# Patient Record
Sex: Female | Born: 1970 | Hispanic: Yes | Marital: Single | Smoking: Never smoker
Health system: Southern US, Community
[De-identification: ages and names within clinical notes are randomized; demographics above are authoritative.]

## PROBLEM LIST (undated history)

## (undated) DIAGNOSIS — C55 Malignant neoplasm of uterus, part unspecified: Secondary | ICD-10-CM

## (undated) DIAGNOSIS — R7303 Prediabetes: Secondary | ICD-10-CM

## (undated) HISTORY — DX: Prediabetes: R73.03

## (undated) HISTORY — DX: Malignant neoplasm of uterus, part unspecified: C55

## (undated) HISTORY — PX: ABDOMINAL HYSTERECTOMY: SHX81

## (undated) HISTORY — PX: VAGINAL HYSTERECTOMY: SUR661

---

## 2016-12-25 ENCOUNTER — Encounter (INDEPENDENT_AMBULATORY_CARE_PROVIDER_SITE_OTHER): Payer: Self-pay | Admitting: Physician Assistant

## 2016-12-25 ENCOUNTER — Ambulatory Visit (INDEPENDENT_AMBULATORY_CARE_PROVIDER_SITE_OTHER): Payer: Self-pay | Admitting: Physician Assistant

## 2016-12-25 VITALS — BP 120/84 | HR 69 | Temp 97.7°F | Ht <= 58 in | Wt 187.8 lb

## 2016-12-25 DIAGNOSIS — S39012A Strain of muscle, fascia and tendon of lower back, initial encounter: Secondary | ICD-10-CM

## 2016-12-25 DIAGNOSIS — E669 Obesity, unspecified: Secondary | ICD-10-CM

## 2016-12-25 DIAGNOSIS — M545 Low back pain, unspecified: Secondary | ICD-10-CM

## 2016-12-25 DIAGNOSIS — G8929 Other chronic pain: Secondary | ICD-10-CM

## 2016-12-25 DIAGNOSIS — M549 Dorsalgia, unspecified: Secondary | ICD-10-CM

## 2016-12-25 LAB — POCT URINALYSIS DIPSTICK
BILIRUBIN UA: NEGATIVE
Blood, UA: NEGATIVE
GLUCOSE UA: NEGATIVE
KETONES UA: NEGATIVE
LEUKOCYTES UA: NEGATIVE
NITRITE UA: NEGATIVE
Protein, UA: NEGATIVE
Spec Grav, UA: 1.02 (ref 1.030–1.035)
Urobilinogen, UA: 0.2 (ref ?–2.0)
pH, UA: 7 (ref 5.0–8.0)

## 2016-12-25 MED ORDER — CYCLOBENZAPRINE HCL 5 MG PO TABS: 5.0000 mg | ORAL_TABLET | Freq: Every day | ORAL | 0 refills | Status: DC

## 2016-12-25 MED ORDER — IBUPROFEN 600 MG PO TABS: 600.0000 mg | ORAL_TABLET | Freq: Three times a day (TID) | ORAL | 0 refills | Status: DC | PRN

## 2016-12-25 MED FILL — IBUPROFEN 600 MG TABLET: 600 | 10 days supply | Qty: 30 | Fill #0

## 2016-12-25 MED FILL — CYCLOBENZAPRINE 5 MG TABLET: 5 | 10 days supply | Qty: 10 | Fill #0

## 2016-12-25 NOTE — Patient Instructions (Signed)
Distensin muscular. (Muscle Strain) Una distensin muscular (estiramiento muscular) ocurre cuando un msculo se estira ms all de la longitud normal. Ocurre cuando una fuerza violenta bruscamente estira demasiado el msculo. Generalmente se desgarran algunas de las fibras del msculo. La distensin muscular es comn en los atletas. La recuperacin normalmente tarda de 1 a 2semanas. La curacin completa tarda de 5 a 6semanas. CUIDADOS EN EL HOGAR  Siga el mtodo PRICE (por sus siglas en ingls) de tratamiento para que la lesin mejore. Hgalo durante los 2 a 3 primeros das despus de la lesin: ? Proteccin. Proteja el msculo para evitar que se vuelva a lesionar. ? Reposo. Limite la actividad y descanse la parte del cuerpo lesionada. ? Hielo. Ponga el hielo en una bolsa plstica. Coloque una toalla entre la piel y la bolsa de hielo. Luego aplique el hielo y djelo actuar de 15 a 20minutos por hora. Despus del tercer da, cambie a compresas de calor hmedo. ? Compresin. Use una frula o venda elstica en la zona lesionada para brindar alivio. No la ajuste demasiado. ? Elevacin. Eleve la zona lesionada por encima del nivel del corazn.  Solo tome los medicamentos que le haya indicado su mdico.  Realice un calentamiento antes de hacer ejercicio para prevenir distensiones musculares futuras.  SOLICITE AYUDA SI:  Siente ms dolor o inflamacin (hinchazn) en la zona lesionada.  Siente adormecimiento, hormigueo o nota una prdida de fuerza en la zona lesionada.  ASEGRESE DE QUE:  Comprende estas instrucciones.  Controlar su afeccin.  Recibir ayuda de inmediato si no mejora o si empeora.  Esta informacin no tiene como fin reemplazar el consejo del mdico. Asegrese de hacerle al mdico cualquier pregunta que tenga. Document Released: 12/25/2008 Document Revised: 07/19/2013 Document Reviewed: 04/27/2013 Elsevier Interactive Patient Education  2017 Elsevier Inc.  

## 2016-12-25 NOTE — Progress Notes (Signed)
Subjective:  Patient ID: Nina Hawkins, female    DOB: [birth year]  Age: 46 y.o. MRN: [id number]  CC: back pain, shoulder pain  HPI Nina Hawkins is a RHD 46 y.o. female with no significant medical history presents with a multiple month history on left sided LBP and left sided upper back pain. Attributed to picking up a heavy object in a twisting motion months ago. Pain waxes and wanes. Better with rest. Has taken Tylenol with some relief. Denies chest pain, SOB, associated HA, abdominal pain, f/c/n/v, rash, or GI/GU sxs.   ROS Review of Systems  Constitutional: Negative for chills, fever and malaise/fatigue.  Eyes: Negative for blurred vision.  Respiratory: Negative for shortness of breath.   Cardiovascular: Negative for chest pain and palpitations.  Gastrointestinal: Negative for abdominal pain and nausea.  Genitourinary: Negative for dysuria and hematuria.  Musculoskeletal: Positive for back pain, joint pain and myalgias (left side of back). Negative for falls and neck pain.  Skin: Negative for rash.  Neurological: Negative for tingling and headaches.  Psychiatric/Behavioral: Negative for depression. The patient is not nervous/anxious.     Objective:  BP 120/84 (BP Location: Left Arm, Patient Position: Sitting, Cuff Size: Large)   Pulse 69   Temp 97.7 F (36.5 C) (Oral)   Ht 4\' 10"  (1.473 m)   Wt 187 lb 12.8 oz (85.2 kg)   LMP  (Exact Date)   SpO2 100%   BMI 39.25 kg/m   BP/Weight 12/25/2016  Systolic BP 120  Diastolic BP 84  Wt. (Lbs) 187.8  BMI 39.25     Physical Exam  Constitutional: She is oriented to person, place, and time.  Obese, NAD, polite  HENT:  Head: Normocephalic and atraumatic.  Eyes: No scleral icterus.  Neck: Normal range of motion. Neck supple. No thyromegaly present.  Cardiovascular: Normal rate, regular rhythm and normal heart sounds.   Pulmonary/Chest: Effort normal and breath sounds normal.  Abdominal: Soft. Bowel sounds are normal. There is  no tenderness.  Musculoskeletal: She exhibits no edema.  Back with full aROM. All provacative testing for the left shoulder positive but inconsistent with pain level that patient portrayed.   Neurological: She is alert and oriented to person, place, and time.  Skin: Skin is warm and dry. No rash noted. No erythema. No pallor.  Psychiatric: She has a normal mood and affect. Her behavior is normal. Thought content normal.  Vitals reviewed.    Assessment & Plan:   1. Back strain, initial encounter - cyclobenzaprine (FLEXERIL) 5 MG tablet; Take 1 tablet (5 mg total) by mouth at bedtime.  Dispense: 10 tablet; Refill: 0 - ibuprofen (ADVIL,MOTRIN) 600 MG tablet; Take 1 tablet (600 mg total) by mouth every 8 (eight) hours as needed.  Dispense: 30 tablet; Refill: 0  2. Chronic left-sided low back pain without sciatica - Urinalysis Dipstick  3. Upper back pain - Likely muscle strain.  4. Obesity, unspecified classification, unspecified obesity type, unspecified whether serious comorbidity present - CBC with Differential/Platelet - Comprehensive metabolic panel - TSH   Meds ordered this encounter  Medications  . cyclobenzaprine (FLEXERIL) 5 MG tablet    Sig: Take 1 tablet (5 mg total) by mouth at bedtime.    Dispense:  10 tablet    Refill:  0    Order Specific Question:   Supervising Provider    Answer:   Quentin AngstJEGEDE, OLUGBEMIGA E [id number]]  . ibuprofen (ADVIL,MOTRIN) 600 MG tablet    Sig: Take 1 tablet (600 mg total) by  mouth every 8 (eight) hours as needed.    Dispense:  30 tablet    Refill:  0    Order Specific Question:   Supervising Provider    Answer:   Quentin Angst [[id number]]    Follow-up: Return in about 4 weeks (around 01/22/2017) for physical exam.   Loletta Specter PA

## 2016-12-26 LAB — CBC WITH DIFFERENTIAL/PLATELET
BASOS: 0 %
Basophils Absolute: 0 10*3/uL (ref 0.0–0.2)
EOS (ABSOLUTE): 0.3 10*3/uL (ref 0.0–0.4)
EOS: 4 %
HEMATOCRIT: 42.1 % (ref 34.0–46.6)
Hemoglobin: 13.8 g/dL (ref 11.1–15.9)
IMMATURE GRANS (ABS): 0 10*3/uL (ref 0.0–0.1)
IMMATURE GRANULOCYTES: 0 %
LYMPHS: 31 %
Lymphocytes Absolute: 2.3 10*3/uL (ref 0.7–3.1)
MCH: 27.7 pg (ref 26.6–33.0)
MCHC: 32.8 g/dL (ref 31.5–35.7)
MCV: 85 fL (ref 79–97)
MONOCYTES: 7 %
Monocytes Absolute: 0.6 10*3/uL (ref 0.1–0.9)
NEUTROS PCT: 58 %
Neutrophils Absolute: 4.3 10*3/uL (ref 1.4–7.0)
PLATELETS: 269 10*3/uL (ref 150–379)
RBC: 4.98 x10E6/uL (ref 3.77–5.28)
RDW: 13.9 % (ref 12.3–15.4)
WBC: 7.5 10*3/uL (ref 3.4–10.8)

## 2016-12-26 LAB — COMPREHENSIVE METABOLIC PANEL
ALK PHOS: 61 IU/L (ref 39–117)
ALT: 15 IU/L (ref 0–32)
AST: 14 IU/L (ref 0–40)
Albumin/Globulin Ratio: 1.5 (ref 1.2–2.2)
Albumin: 4 g/dL (ref 3.5–5.5)
BUN/Creatinine Ratio: 23 (ref 9–23)
BUN: 15 mg/dL (ref 6–24)
Bilirubin Total: 0.2 mg/dL (ref 0.0–1.2)
CALCIUM: 9.3 mg/dL (ref 8.7–10.2)
CO2: 23 mmol/L (ref 18–29)
CREATININE: 0.64 mg/dL (ref 0.57–1.00)
Chloride: 99 mmol/L (ref 96–106)
GFR calc Af Amer: 125 mL/min/{1.73_m2} (ref >59)
GFR calc non Af Amer: 108 mL/min/{1.73_m2} (ref >59)
GLOBULIN, TOTAL: 2.6 g/dL (ref 1.5–4.5)
Glucose: 96 mg/dL (ref 65–99)
Potassium: 4.2 mmol/L (ref 3.5–5.2)
Sodium: 140 mmol/L (ref 134–144)
TOTAL PROTEIN: 6.6 g/dL (ref 6.0–8.5)

## 2016-12-26 LAB — TSH: TSH: 4 u[IU]/mL (ref 0.450–4.500)

## 2017-01-12 ENCOUNTER — Telehealth (INDEPENDENT_AMBULATORY_CARE_PROVIDER_SITE_OTHER): Payer: Self-pay | Admitting: Physician Assistant

## 2017-01-12 NOTE — Telephone Encounter (Signed)
Patient called requesting lab results  Please follow up with patient Ph: [phone]

## 2017-01-12 NOTE — Telephone Encounter (Signed)
Routed to PCP. Nina Hawkins, CMA  

## 2017-01-13 ENCOUNTER — Other Ambulatory Visit (INDEPENDENT_AMBULATORY_CARE_PROVIDER_SITE_OTHER): Payer: Self-pay | Admitting: Physician Assistant

## 2017-01-13 NOTE — Telephone Encounter (Signed)
I called both listed numbers and left message to call back to discuss laboratory results.

## 2017-02-01 ENCOUNTER — Ambulatory Visit (INDEPENDENT_AMBULATORY_CARE_PROVIDER_SITE_OTHER): Payer: Self-pay | Admitting: Physician Assistant

## 2017-02-02 ENCOUNTER — Ambulatory Visit (INDEPENDENT_AMBULATORY_CARE_PROVIDER_SITE_OTHER): Payer: Self-pay | Admitting: Physician Assistant

## 2017-02-02 ENCOUNTER — Encounter (INDEPENDENT_AMBULATORY_CARE_PROVIDER_SITE_OTHER): Payer: Self-pay | Admitting: Physician Assistant

## 2017-02-02 VITALS — BP 122/83 | HR 71 | Temp 97.8°F | Ht <= 58 in | Wt 181.2 lb

## 2017-02-02 DIAGNOSIS — Z114 Encounter for screening for human immunodeficiency virus [HIV]: Secondary | ICD-10-CM

## 2017-02-02 DIAGNOSIS — M542 Cervicalgia: Secondary | ICD-10-CM

## 2017-02-02 DIAGNOSIS — G8929 Other chronic pain: Secondary | ICD-10-CM

## 2017-02-02 DIAGNOSIS — M545 Low back pain, unspecified: Secondary | ICD-10-CM

## 2017-02-02 DIAGNOSIS — Z23 Encounter for immunization: Secondary | ICD-10-CM

## 2017-02-02 DIAGNOSIS — R1013 Epigastric pain: Secondary | ICD-10-CM

## 2017-02-02 DIAGNOSIS — Z1231 Encounter for screening mammogram for malignant neoplasm of breast: Secondary | ICD-10-CM

## 2017-02-02 MED ORDER — ACETAMINOPHEN 500 MG PO TABS: 1000.0000 mg | ORAL_TABLET | Freq: Three times a day (TID) | ORAL | 0 refills | Status: AC | PRN

## 2017-02-02 MED ORDER — OMEPRAZOLE 40 MG PO CPDR: 40.0000 mg | DELAYED_RELEASE_CAPSULE | Freq: Every day | ORAL | 3 refills | Status: DC

## 2017-02-02 MED ORDER — CYCLOBENZAPRINE HCL 10 MG PO TABS: 10.0000 mg | ORAL_TABLET | Freq: Every day | ORAL | 0 refills | Status: DC

## 2017-02-02 MED FILL — CYCLOBENZAPRINE 10 MG TAB: 10 | 7 days supply | Qty: 14 | Fill #0

## 2017-02-02 MED FILL — OMEPRAZOLE DR 40 MG CAPSULE: 40 | 30 days supply | Qty: 30 | Fill #0

## 2017-02-02 NOTE — Progress Notes (Signed)
Subjective:  Patient ID: Nina Hawkins, female    DOB: April 05, 1971  Age: 46 y.o. MRN: [id number]  CC: abdominal pain, neck pain, back pain  HPI Nina Hawkins is a 46 y.o. female with a PMH of back pain and neck pain. Presents today with complaint of epigastric discomfort for approximately 6 months. Fatty foods aggravate pain. Associated with constipation and bloating. No nausea, vomiting, hematochezia. Also has persistent cervicalgia and back pain since last visit. Says NSAID and Flexeril helped with the pain but her job is physically demanding, up to 14 hours per day, which has contributed to her continued pains. Does not endorse any other symptoms.   Outpatient Medications Prior to Visit  Medication Sig Dispense Refill  . cyclobenzaprine (FLEXERIL) 5 MG tablet Take 1 tablet (5 mg total) by mouth at bedtime. 10 tablet 0  . ibuprofen (ADVIL,MOTRIN) 600 MG tablet Take 1 tablet (600 mg total) by mouth every 8 (eight) hours as needed. 30 tablet 0   No facility-administered medications prior to visit.       Review of Systems  Constitutional: Negative for chills, fever and malaise/fatigue.  Eyes: Negative for blurred vision.  Respiratory: Negative for shortness of breath.   Cardiovascular: Negative for chest pain and palpitations.  Gastrointestinal: Positive for abdominal pain and constipation. Negative for blood in stool, melena, nausea and vomiting.  Genitourinary: Negative for dysuria and hematuria.  Musculoskeletal: Positive for back pain and neck pain. Negative for joint pain and myalgias.  Skin: Negative for rash.  Neurological: Negative for tingling and headaches.  Psychiatric/Behavioral: Negative for depression. The patient is not nervous/anxious.     Objective:  BP 122/83 (BP Location: Left Arm, Patient Position: Sitting, Cuff Size: Large)   Pulse 71   Temp 97.8 F (36.6 C) (Oral)   Ht  (1.473 m)   Wt 181 lb 3.2 oz (82.2 kg)   SpO2 97%   BMI 37.87 kg/m   BP/Weight  02/02/2017 12/25/2016  Systolic BP 122 120  Diastolic BP 83 84  Wt. (Lbs) 181.2 187.8  BMI 37.87 39.25      Physical Exam  Constitutional: She is oriented to person, place, and time.  Well developed, obese, NAD, polite  HENT:  Head: Normocephalic and atraumatic.  Eyes: No scleral icterus.  Neck: Normal range of motion. Neck supple. No thyromegaly present.  Cardiovascular: Normal rate, regular rhythm and normal heart sounds.   Pulmonary/Chest: Effort normal and breath sounds normal.  Abdominal: Soft. Bowel sounds are normal. She exhibits no distension and no mass. There is tenderness (mild generalized TTP in all quadrants). There is no rebound and no guarding.  Musculoskeletal: She exhibits no edema or deformity.  Neck and back with full aROM and mild TTP along the left paraspinals of C-T-L spine.  Neurological: She is alert and oriented to person, place, and time. No cranial nerve deficit. Coordination normal.  Skin: Skin is warm and dry. No rash noted. No erythema. No pallor.  Psychiatric: She has a normal mood and affect. Her behavior is normal. Thought content normal.  Vitals reviewed.    Assessment & Plan:   1. Screening mammogram, encounter for - MM DIGITAL SCREENING BILATERAL; Future  2. Encounter for special screening examination for HIV - HIV antibody  3. Abdominal pain, epigastric - omeprazole (PRILOSEC) 40 MG capsule; Take 1 capsule (40 mg total) by mouth daily.  Dispense: 30 capsule; Refill: 3 - Lipase - H. pylori antibody, IgG  4. Need for Tdap vaccination - Tdap vaccine greater  than or equal to 7yo IM  5. Cervicalgia  - Increase cyclobenzaprine to 10 mg QHS - Likely attributed to occupation's requirement of prolonged standing for up to 14 hours at a time. Pt not willing to search for another occupation due to difficulty in finding work.  6. Low back pain - Begin tylenol 500 mg two tabs TID - Likely attributed to occupation's requirement of prolonged  standing for up to 14 hours at a time. Pt not willing to search for another occupation due to difficulty in finding work.  Meds ordered this encounter  Medications  . omeprazole (PRILOSEC) 40 MG capsule    Sig: Take 1 capsule (40 mg total) by mouth daily.    Dispense:  30 capsule    Refill:  3    Order Specific Question:   Supervising Provider    Answer:   Quentin Angst [id number]    Follow-up: Return in about 1 week (around 02/09/2017) for PAP smear.   Loletta Specter PA

## 2017-02-02 NOTE — Patient Instructions (Signed)
Dolor abdominal en adultos °Abdominal Pain, Adult °El dolor abdominal puede tener muchas causas. A menudo, no es grave y mejora sin tratamiento o con tratamiento en la casa. Sin embargo, a veces el dolor abdominal es intenso. El médico revisará sus antecedentes médicos y le hará un examen físico para tratar de determinar la causa del dolor abdominal. °Siga estas instrucciones en su casa: °· Tome los medicamentos de venta libre y los recetados solamente como se lo haya indicado el médico. No tome un laxante a menos que se lo haya indicado el médico. °· Beba suficiente líquido para mantener la orina clara o de color amarillo pálido. °· Controle su afección para ver si hay cambios. °· Concurra a todas las visitas de control como se lo haya indicado el médico. Esto es importante. °Comuníquese con un médico si: °· El dolor abdominal cambia o empeora. °· No tiene apetito o baja de peso sin proponérselo. °· Está estreñido o tiene diarrea durante más de 2 o 3 días. °· Tiene dolor cuando orina o defeca. °· El dolor abdominal lo despierta de noche. °· El dolor empeora con las comidas, después de comer o con determinados alimentos. °· Tiene vómitos y no puede retener nada. °· Tiene fiebre. °Solicite ayuda de inmediato si: °· El dolor no desaparece tan pronto como el médico le dijo que era esperable. °· No puede detener los vómitos. °· El dolor se siente solo en zonas del abdomen, como el lado derecho o la parte inferior izquierda del abdomen. °· Las heces son sanguinolentas o de color negro, o de aspecto alquitranado. °· Tiene dolor intenso, cólicos, o meteorismo en el abdomen. °· Tiene signos de deshidratación, por ejemplo: °? Orina oscura, muy escasa o falta de orina. °? Labios agrietados. °? Boca seca. °? Ojos hundidos. °? Somnolencia. °? Debilidad. °Esta información no tiene como fin reemplazar el consejo del médico. Asegúrese de hacerle al médico cualquier pregunta que tenga. °Document Released: 09/28/2005 Document  Revised: 09/17/2016 Document Reviewed: 03/11/2016 °Elsevier Interactive Patient Education © 2017 Elsevier Inc. ° °

## 2017-02-03 ENCOUNTER — Other Ambulatory Visit (INDEPENDENT_AMBULATORY_CARE_PROVIDER_SITE_OTHER): Payer: Self-pay | Admitting: Physician Assistant

## 2017-02-03 DIAGNOSIS — A048 Other specified bacterial intestinal infections: Secondary | ICD-10-CM

## 2017-02-03 LAB — HIV ANTIBODY (ROUTINE TESTING W REFLEX): HIV SCREEN 4TH GENERATION: NONREACTIVE

## 2017-02-03 LAB — LIPASE: Lipase: 57 U/L (ref 14–72)

## 2017-02-03 LAB — H. PYLORI ANTIBODY, IGG: H. pylori, IgG AbS: 3.74 Index Value — ABNORMAL HIGH (ref 0.00–0.79)

## 2017-02-03 MED ORDER — CLARITHROMYCIN 500 MG PO TABS: 500.0000 mg | ORAL_TABLET | Freq: Two times a day (BID) | ORAL | 0 refills | Status: AC

## 2017-02-03 MED ORDER — AMOXICILLIN 500 MG PO TABS: 1000.0000 mg | ORAL_TABLET | Freq: Two times a day (BID) | ORAL | 0 refills | Status: AC

## 2017-02-03 NOTE — Progress Notes (Signed)
H pylori IgG positive.

## 2017-02-11 ENCOUNTER — Ambulatory Visit (INDEPENDENT_AMBULATORY_CARE_PROVIDER_SITE_OTHER): Payer: Self-pay | Admitting: Physician Assistant

## 2017-03-23 ENCOUNTER — Encounter (HOSPITAL_COMMUNITY): Payer: Self-pay | Admitting: *Deleted

## 2017-03-23 ENCOUNTER — Ambulatory Visit (INDEPENDENT_AMBULATORY_CARE_PROVIDER_SITE_OTHER): Payer: Self-pay

## 2017-03-23 ENCOUNTER — Ambulatory Visit (HOSPITAL_COMMUNITY)
Admission: EM | Admit: 2017-03-23 | Discharge: 2017-03-23 | Disposition: A | Discharge status: Home / Self Care | Payer: Self-pay | Attending: Family Medicine | Admitting: Family Medicine

## 2017-03-23 DIAGNOSIS — R197 Diarrhea, unspecified: Secondary | ICD-10-CM

## 2017-03-23 DIAGNOSIS — K529 Noninfective gastroenteritis and colitis, unspecified: Secondary | ICD-10-CM

## 2017-03-23 DIAGNOSIS — R1084 Generalized abdominal pain: Secondary | ICD-10-CM

## 2017-03-23 MED ORDER — HYOSCYAMINE SULFATE SL 0.125 MG SL SUBL: 1.0000 | SUBLINGUAL_TABLET | Freq: Every morning | SUBLINGUAL | 0 refills | Status: DC

## 2017-03-23 MED FILL — HYOSCYAMINE 0.125 MG TAB SL: 0.125 | 30 days supply | Qty: 30 | Fill #0

## 2017-03-23 NOTE — ED Triage Notes (Signed)
Pt  Reports     abd   Pain          Worse   After  Eating      Denies       Any   Vomiting       some diarrhea

## 2017-03-23 NOTE — ED Provider Notes (Signed)
CSN: [id number]     Arrival date & time 03/23/17  1012 History   None    Chief Complaint  Patient presents with  . Abdominal Pain   (Consider location/radiation/quality/duration/timing/severity/associated sxs/prior Treatment) C/o abdominal pain for a month and her pain is generalized and she states it is worse after she eats. She denies any NVD.  She does not have daily BM's.   The history is provided by the patient.  Abdominal Pain  Pain location:  Generalized Pain quality: aching, bloating and fullness   Pain radiates to:  Does not radiate Pain severity:  Moderate Onset quality:  Gradual Duration:  4 weeks Timing:  Constant Progression:  Worsening Chronicity:  New Relieved by:  Nothing Worsened by:  Nothing Ineffective treatments:  None tried   History reviewed. No pertinent past medical history. Past Surgical History:  Procedure Laterality Date  . ABDOMINAL HYSTERECTOMY     History reviewed. No pertinent family history. Social History  Substance Use Topics  . Smoking status: Never Smoker  . Smokeless tobacco: Never Used  . Alcohol use Yes   OB History    No data available     Review of Systems  Constitutional: Negative.   HENT: Negative.   Eyes: Negative.   Respiratory: Negative.   Cardiovascular: Negative.   Gastrointestinal: Positive for abdominal pain.  Endocrine: Negative.   Genitourinary: Negative.   Musculoskeletal: Negative.   Allergic/Immunologic: Negative.   Neurological: Negative.   Hematological: Negative.   Psychiatric/Behavioral: Negative.     Allergies  Patient has no known allergies.  Home Medications   Prior to Admission medications   Medication Sig Start Date End Date Taking? Authorizing Provider  cyclobenzaprine (FLEXERIL) 10 MG tablet Take 1 tablet (10 mg total) by mouth at bedtime. 02/02/17   Loletta SpecterGomez, Roger David, PA-C  Hyoscyamine Sulfate SL (LEVSIN/SL) 0.125 MG SUBL Place 1 tablet under the tongue every morning. 03/23/17    Deatra Canterxford, William J, FNP  omeprazole (PRILOSEC) 40 MG capsule Take 1 capsule (40 mg total) by mouth daily. 02/02/17   Loletta SpecterGomez, Roger David, PA-C   Meds Ordered and Administered this Visit  Medications - No data to display  BP (!) 150/100 (BP Location: Right Arm)   Pulse 78   Temp 98.6 F (37 C) (Oral)   Resp 18   SpO2 100%  No data found.   Physical Exam  Constitutional: She appears well-developed and well-nourished.  HENT:  Head: Normocephalic and atraumatic.  Eyes: Conjunctivae and EOM are normal. Pupils are equal, round, and reactive to light.  Neck: Normal range of motion. Neck supple.  Cardiovascular: Normal rate, regular rhythm and normal heart sounds.   Pulmonary/Chest: Effort normal and breath sounds normal.  Abdominal: Soft. Bowel sounds are normal. There is tenderness.  TTP RUQ, LUQ, Epigastric Region, and LLQ.  No guarding or rebound tenderness.  Nursing note and vitals reviewed.   Urgent Care Course     Procedures (including critical care time)  Labs Review Labs Reviewed - No data to display  Imaging Review Dg Abd 1 View  Result Date: 03/23/2017 CLINICAL DATA:  Patient states that she's had abdominal pain since today, more pain with eating. Denies any vomiting or diarrhea. No Hx EXAM: ABDOMEN - 1 VIEW COMPARISON:  09/04/2013 FINDINGS: Normal bowel gas pattern with no evidence of obstruction. Small density projects over the left kidney. This is consistent with intrarenal stone, not evident on the prior exam. Soft tissues are otherwise unremarkable. No significant skeletal abnormality. IMPRESSION: 1. No  acute findings.  No evidence of bowel obstruction. 2. Probable small left intrarenal stone. Electronically Signed   By: Amie Portland M.D.   On: 03/23/2017 11:09     Visual Acuity Review  Right Eye Distance:   Left Eye Distance:   Bilateral Distance:    Right Eye Near:   Left Eye Near:    Bilateral Near:         MDM   1. Noninfectious gastroenteritis,  unspecified type   2. Diarrhea, unspecified type   3. Generalized abdominal pain    Levsin 0.125mg  Take one po qid prn #30  Push po fluids, rest, tylenol and motrin otc prn as directed for fever, arthralgias, and myalgias.  Follow up prn if sx's continue or persist.    Deatra Canter, FNP 03/23/17 1116

## 2017-04-06 ENCOUNTER — Ambulatory Visit: Payer: Self-pay | Attending: Internal Medicine

## 2017-04-23 ENCOUNTER — Ambulatory Visit (INDEPENDENT_AMBULATORY_CARE_PROVIDER_SITE_OTHER): Payer: Self-pay

## 2017-05-21 ENCOUNTER — Ambulatory Visit (INDEPENDENT_AMBULATORY_CARE_PROVIDER_SITE_OTHER): Payer: Self-pay | Admitting: Physician Assistant

## 2017-05-21 ENCOUNTER — Encounter (INDEPENDENT_AMBULATORY_CARE_PROVIDER_SITE_OTHER): Payer: Self-pay | Admitting: Physician Assistant

## 2017-05-21 ENCOUNTER — Other Ambulatory Visit (HOSPITAL_COMMUNITY)
Admission: RE | Admit: 2017-05-21 | Discharge: 2017-05-21 | Disposition: A | Discharge status: Home / Self Care | Payer: Self-pay | Source: Ambulatory Visit | Attending: Physician Assistant | Admitting: Physician Assistant

## 2017-05-21 VITALS — BP 132/87 | HR 67 | Temp 98.2°F | Ht <= 58 in | Wt 180.4 lb

## 2017-05-21 DIAGNOSIS — M545 Low back pain, unspecified: Secondary | ICD-10-CM

## 2017-05-21 DIAGNOSIS — Z1272 Encounter for screening for malignant neoplasm of vagina: Secondary | ICD-10-CM | POA: Insufficient documentation

## 2017-05-21 DIAGNOSIS — Z1231 Encounter for screening mammogram for malignant neoplasm of breast: Secondary | ICD-10-CM

## 2017-05-21 DIAGNOSIS — Z0000 Encounter for general adult medical examination without abnormal findings: Secondary | ICD-10-CM

## 2017-05-21 DIAGNOSIS — G8929 Other chronic pain: Secondary | ICD-10-CM

## 2017-05-21 DIAGNOSIS — M542 Cervicalgia: Secondary | ICD-10-CM

## 2017-05-21 DIAGNOSIS — Z1239 Encounter for other screening for malignant neoplasm of breast: Secondary | ICD-10-CM

## 2017-05-21 MED ORDER — NAPROXEN 500 MG PO TABS: 500.0000 mg | ORAL_TABLET | Freq: Two times a day (BID) | ORAL | 0 refills | Status: DC

## 2017-05-21 MED FILL — NAPROXEN 500 MG TABLET: 500 | 15 days supply | Qty: 30 | Fill #0

## 2017-05-21 NOTE — Progress Notes (Signed)
Subjective:  Patient ID: Nina Hawkins, female    DOB: [birth year]  Age: 46 y.o. MRN: [id number]  CC: annual exam  HPI Nina Hawkins is a 46 y.o. female with a PMH of chronic left sided LBP and left sided cervicalgia presents for an annual exam. Complains of persistent pain in the whole left side of body. Pain primarily in the left side of neck and left side of back. Initially attributed to prolonged standing at work for 14 hours per day. However, she has taken a break from work and feels the same pains. Unable to sleep on her left side due to pain. Does not endorse CP, palpitations, SOB, HA, abdominal pain, tingling, numbness, weakness, f/c/n/v, rash, or GI/GU sxs.    Outpatient Medications Prior to Visit  Medication Sig Dispense Refill  . cyclobenzaprine (FLEXERIL) 10 MG tablet Take 1 tablet (10 mg total) by mouth at bedtime. 14 tablet 0  . Hyoscyamine Sulfate SL (LEVSIN/SL) 0.125 MG SUBL Place 1 tablet under the tongue every morning. 30 each 0  . omeprazole (PRILOSEC) 40 MG capsule Take 1 capsule (40 mg total) by mouth daily. 30 capsule 3   No facility-administered medications prior to visit.      ROS Review of Systems  Constitutional: Negative for chills, fever and malaise/fatigue.  Eyes: Negative for blurred vision.  Respiratory: Negative for shortness of breath.   Cardiovascular: Negative for chest pain and palpitations.  Gastrointestinal: Negative for abdominal pain and nausea.  Genitourinary: Negative for dysuria and hematuria.  Musculoskeletal: Positive for back pain and neck pain. Negative for joint pain and myalgias.  Skin: Negative for rash.  Neurological: Negative for tingling and headaches.  Psychiatric/Behavioral: Negative for depression. The patient is not nervous/anxious.     Objective:  BP 132/87 (BP Location: Left Arm, Patient Position: Sitting, Cuff Size: Large)   Pulse 67   Temp 98.2 F (36.8 C) (Oral)   Ht 4' 9.87" (1.47 m)   Wt 180 lb 6.4 oz (81.8 kg)    SpO2 99%   BMI 37.87 kg/m   BP/Weight 05/21/2017 03/23/2017 02/02/2017  Systolic BP 132 150 122  Diastolic BP 87 100 83  Wt. (Lbs) 180.4 - 181.2  BMI 37.87 - 37.87      Physical Exam  Constitutional: She is oriented to person, place, and time.  Well developed, obese, NAD, polite  HENT:  Head: Normocephalic and atraumatic.  Eyes: Conjunctivae and EOM are normal. No scleral icterus.  Neck: Normal range of motion. Neck supple. No thyromegaly present.  Cardiovascular: Normal rate, regular rhythm and normal heart sounds.   Pulmonary/Chest: Effort normal and breath sounds normal. No respiratory distress.  Abdominal: Soft. Bowel sounds are normal. She exhibits no distension. There is no tenderness.  Genitourinary:  Genitourinary Comments: No uterus/cervix present. No vaginal discharge. No adnexal mass or tenderness bilaterally. PAP collection taken from the vaginal vault.  Musculoskeletal: She exhibits no edema.  UEs, LEs, and back with full aROM. Pain elicited on back extension and left lateral flexion.  Lymphadenopathy:    She has no cervical adenopathy.  Neurological: She is alert and oriented to person, place, and time. No cranial nerve deficit. Coordination normal.  Skin: Skin is warm and dry. No rash noted. No erythema. No pallor.  Psychiatric: She has a normal mood and affect. Her behavior is normal. Thought content normal.  Vitals reviewed.    Assessment & Plan:    1. Annual physical exam - CBC with Differential - Comprehensive metabolic panel - Lipid  Panel  2. Cervicalgia - DG Cervical Spine Complete; Future - Begin naproxen (NAPROSYN) 500 MG tablet; Take 1 tablet (500 mg total) by mouth 2 (two) times daily with a meal.  Dispense: 30 tablet; Refill: 0  3. Chronic left-sided low back pain without sciatica - DG Lumbar Spine Complete - Begin naproxen (NAPROSYN) 500 MG tablet; Take 1 tablet (500 mg total) by mouth 2 (two) times daily with a meal.  Dispense: 30 tablet;  Refill: 0  4. Screening for vaginal cancer - Cytology - PAP [place]  5. Screening for breast cancer - [place] DIGITAL SCREENING BILATERAL; Future   Meds ordered this encounter  Medications  . naproxen (NAPROSYN) 500 MG tablet    Sig: Take 1 tablet (500 mg total) by mouth 2 (two) times daily with a meal.    Dispense:  30 tablet    Refill:  0    Order Specific Question:   Supervising Provider    Answer:   Quentin AngstJEGEDE, OLUGBEMIGA E [id number]    Follow-up: Return in about 4 weeks (around 06/18/2017) for back pain.   Loletta Specteroger David Gomez PA

## 2017-05-21 NOTE — Patient Instructions (Signed)
Dolor de espalda en adultos  (Back Pain, Adult)  El dolor de espalda es muy frecuente. A menudo mejora con el tiempo. La causa del dolor de espalda generalmente no es peligrosa. La mayora de las personas puede aprender a manejar el dolor de espalda por s mismas.  CUIDADOS EN EL HOGAR  Controle su dolor de espalda a fin de detectar algn cambio. Las siguientes indicaciones ayudarn a aliviar cualquier dolor que pueda sentir:   Mantngase activo. Comience con caminatas cortas sobre superficies planas si es posible. Trate de caminar un poco ms cada da.   Haga ejercicios con regularidad tal como le indic el mdico. El ejercicio ayuda a que su espalda se cure ms rpidamente. Tambin ayuda a prevenir futuras lesiones al mantener los msculos fuertes y flexibles.   No se siente, conduzca ni permanezca de pie durante ms de 30 minutos.   No permanezca en la cama. Si hace reposo ms de 1 a 2 das, puede demorar su recuperacin.   Sea cuidadoso al inclinarse o levantar un objeto. Use una tcnica apropiada para levantar peso:  ? Flexione las rodillas.  ? Mantenga el objeto cerca del cuerpo.  ? No gire.   Duerma sobre un colchn firme. Recustese sobre un costado y flexione las rodillas. Si se recuesta sobre la espalda, coloque una almohada debajo de las rodillas.   Tome los medicamentos solamente como se lo haya indicado el mdico.   Aplique hielo sobre la zona lesionada.  ? Ponga el hielo en una bolsa plstica.  ? Coloque una toalla entre la piel y la bolsa de hielo.  ? Deje el hielo durante 20minutos, 2 a 3veces por da, durante los primeros 2 o 3das. Despus de eso, puede alternar entre compresas de hielo y calor.   Evite sentir ansiedad o estrs. Encuentre maneras efectivas de lidiar con el estrs, como hacer ejercicio.   Mantenga un peso saludable. El peso excesivo ejerce tensin sobre la espalda.  SOLICITE AYUDA SI:   Siente dolor que no se alivia con reposo o medicamentos.   Siente cada vez ms  dolor que se extiende a las piernas o los glteos.   El dolor no mejora en una semana.   Siente dolor por la noche.   Pierde peso.   Siente escalofros o fiebre.  SOLICITE AYUDA DE INMEDIATO SI:   No puede controlar su materia fecal (heces) o el pis (orina).   Siente debilidad en las piernas o los brazos.   Siente prdida de la sensibilidad (adormecimiento) en las piernas o los brazos.   Tiene malestar estomacal (nuseas) o vomita.   Siente dolor de estmago (abdominal).   Siente que se desvanece (se desmaya).  Esta informacin no tiene como fin reemplazar el consejo del mdico. Asegrese de hacerle al mdico cualquier pregunta que tenga.  Document Released: 04/13/2011 Document Revised: 10/19/2014 Document Reviewed: 01/30/2014  Elsevier Interactive Patient Education  2018 [employer]

## 2017-05-22 LAB — CBC WITH DIFFERENTIAL/PLATELET
BASOS: 0 %
Basophils Absolute: 0 10*3/uL (ref 0.0–0.2)
EOS (ABSOLUTE): 0.3 10*3/uL (ref 0.0–0.4)
EOS: 5 %
HEMATOCRIT: 42.8 % (ref 34.0–46.6)
HEMOGLOBIN: 14.1 g/dL (ref 11.1–15.9)
IMMATURE GRANS (ABS): 0 10*3/uL (ref 0.0–0.1)
Immature Granulocytes: 0 %
LYMPHS: 37 %
Lymphocytes Absolute: 2.2 10*3/uL (ref 0.7–3.1)
MCH: 27.1 pg (ref 26.6–33.0)
MCHC: 32.9 g/dL (ref 31.5–35.7)
MCV: 82 fL (ref 79–97)
Monocytes Absolute: 0.5 10*3/uL (ref 0.1–0.9)
Monocytes: 8 %
NEUTROS ABS: 2.9 10*3/uL (ref 1.4–7.0)
Neutrophils: 50 %
Platelets: 271 10*3/uL (ref 150–379)
RBC: 5.2 x10E6/uL (ref 3.77–5.28)
RDW: 14.5 % (ref 12.3–15.4)
WBC: 5.9 10*3/uL (ref 3.4–10.8)

## 2017-05-22 LAB — LIPID PANEL
CHOL/HDL RATIO: 4 ratio (ref 0.0–4.4)
Cholesterol, Total: 188 mg/dL (ref 100–199)
HDL: 47 mg/dL (ref >39)
LDL Calculated: 103 mg/dL — ABNORMAL HIGH (ref 0–99)
Triglycerides: 190 mg/dL — ABNORMAL HIGH (ref 0–149)
VLDL Cholesterol Cal: 38 mg/dL (ref 5–40)

## 2017-05-22 LAB — COMPREHENSIVE METABOLIC PANEL
A/G RATIO: 1.7 (ref 1.2–2.2)
ALBUMIN: 4.3 g/dL (ref 3.5–5.5)
ALT: 19 IU/L (ref 0–32)
AST: 18 IU/L (ref 0–40)
Alkaline Phosphatase: 76 IU/L (ref 39–117)
BILIRUBIN TOTAL: 0.3 mg/dL (ref 0.0–1.2)
BUN / CREAT RATIO: 14 (ref 9–23)
BUN: 10 mg/dL (ref 6–24)
CALCIUM: 9.3 mg/dL (ref 8.7–10.2)
CHLORIDE: 99 mmol/L (ref 96–106)
CO2: 25 mmol/L (ref 20–29)
Creatinine, Ser: 0.72 mg/dL (ref 0.57–1.00)
GFR calc Af Amer: 117 mL/min/{1.73_m2} (ref >59)
GFR, EST NON AFRICAN AMERICAN: 101 mL/min/{1.73_m2} (ref >59)
GLOBULIN, TOTAL: 2.6 g/dL (ref 1.5–4.5)
Glucose: 104 mg/dL — ABNORMAL HIGH (ref 65–99)
POTASSIUM: 4.1 mmol/L (ref 3.5–5.2)
Sodium: 137 mmol/L (ref 134–144)
TOTAL PROTEIN: 6.9 g/dL (ref 6.0–8.5)

## 2017-05-24 ENCOUNTER — Other Ambulatory Visit (INDEPENDENT_AMBULATORY_CARE_PROVIDER_SITE_OTHER): Payer: Self-pay | Admitting: Physician Assistant

## 2017-05-24 ENCOUNTER — Telehealth (INDEPENDENT_AMBULATORY_CARE_PROVIDER_SITE_OTHER): Payer: Self-pay | Admitting: Physician Assistant

## 2017-05-24 ENCOUNTER — Ambulatory Visit (HOSPITAL_COMMUNITY)
Admission: RE | Admit: 2017-05-24 | Discharge: 2017-05-24 | Disposition: A | Discharge status: Home / Self Care | Payer: Self-pay | Source: Ambulatory Visit | Attending: Physician Assistant | Admitting: Physician Assistant

## 2017-05-24 DIAGNOSIS — M542 Cervicalgia: Secondary | ICD-10-CM | POA: Insufficient documentation

## 2017-05-24 DIAGNOSIS — N760 Acute vaginitis: Principal | ICD-10-CM

## 2017-05-24 DIAGNOSIS — M545 Low back pain: Secondary | ICD-10-CM | POA: Insufficient documentation

## 2017-05-24 DIAGNOSIS — G8929 Other chronic pain: Secondary | ICD-10-CM | POA: Insufficient documentation

## 2017-05-24 DIAGNOSIS — B9689 Other specified bacterial agents as the cause of diseases classified elsewhere: Secondary | ICD-10-CM

## 2017-05-24 DIAGNOSIS — E781 Pure hyperglyceridemia: Secondary | ICD-10-CM

## 2017-05-24 LAB — CYTOLOGY - PAP
ADEQUACY: ABSENT
BACTERIAL VAGINITIS: POSITIVE — AB
Candida vaginitis: NEGATIVE
Chlamydia: NEGATIVE
Diagnosis: NEGATIVE
NEISSERIA GONORRHEA: NEGATIVE
TRICH (WINDOWPATH): NEGATIVE

## 2017-05-24 MED ORDER — METRONIDAZOLE 500 MG PO TABS: 500.0000 mg | ORAL_TABLET | Freq: Two times a day (BID) | ORAL | 0 refills | Status: AC

## 2017-05-24 MED ORDER — MEGARED OMEGA-3 KRILL OIL 500 MG PO CAPS: 2.0000 | ORAL_CAPSULE | Freq: Every day | ORAL | 5 refills | Status: DC

## 2017-05-24 NOTE — Telephone Encounter (Signed)
Spanish Patient was here on Friday and said  that PA Lily KocherGomez was going to give her a letter for her job with restrictions she can only work 8 hours and no lifting  Heavy stuff Thank you . Please, call her to pick it up

## 2017-05-24 NOTE — Telephone Encounter (Signed)
FWD to PCP. Nina Hawkins Nina Hawkins, CMA  

## 2017-05-25 ENCOUNTER — Encounter (INDEPENDENT_AMBULATORY_CARE_PROVIDER_SITE_OTHER): Payer: Self-pay | Admitting: Physician Assistant

## 2017-05-25 MED FILL — metroNIDAZOLE 500 MG TABS: 500 | 7 days supply | Qty: 14 | Fill #0

## 2017-05-25 NOTE — Telephone Encounter (Signed)
Noted  

## 2017-05-25 NOTE — Telephone Encounter (Signed)
Please call pt and tell her the note is ready.

## 2017-05-25 NOTE — Telephone Encounter (Signed)
Noted  Left a voice mail message for her to pick up the letter Thank you

## 2017-05-26 ENCOUNTER — Encounter (INDEPENDENT_AMBULATORY_CARE_PROVIDER_SITE_OTHER): Payer: Self-pay | Admitting: Physician Assistant

## 2017-06-09 ENCOUNTER — Encounter (INDEPENDENT_AMBULATORY_CARE_PROVIDER_SITE_OTHER): Payer: Self-pay | Admitting: Physician Assistant

## 2017-06-09 ENCOUNTER — Ambulatory Visit (INDEPENDENT_AMBULATORY_CARE_PROVIDER_SITE_OTHER): Payer: Self-pay | Admitting: Physician Assistant

## 2017-06-09 VITALS — BP 112/73 | HR 77 | Temp 97.8°F | Ht <= 58 in | Wt 180.2 lb

## 2017-06-09 DIAGNOSIS — M545 Low back pain, unspecified: Secondary | ICD-10-CM

## 2017-06-09 DIAGNOSIS — G8929 Other chronic pain: Secondary | ICD-10-CM

## 2017-06-09 DIAGNOSIS — M542 Cervicalgia: Secondary | ICD-10-CM

## 2017-06-09 DIAGNOSIS — R739 Hyperglycemia, unspecified: Secondary | ICD-10-CM

## 2017-06-09 DIAGNOSIS — R7303 Prediabetes: Secondary | ICD-10-CM

## 2017-06-09 LAB — POCT GLYCOSYLATED HEMOGLOBIN (HGB A1C): HEMOGLOBIN A1C: 6.4

## 2017-06-09 MED ORDER — METHOCARBAMOL 500 MG PO TABS: 500.0000 mg | ORAL_TABLET | Freq: Three times a day (TID) | ORAL | 2 refills | Status: DC

## 2017-06-09 MED ORDER — IBUPROFEN 600 MG PO TABS: 600.0000 mg | ORAL_TABLET | Freq: Three times a day (TID) | ORAL | 2 refills | Status: DC | PRN

## 2017-06-09 MED FILL — METHOCARBAMOL 500 MG TABS: 500 | 10 days supply | Qty: 30 | Fill #0

## 2017-06-09 MED FILL — IBUPROFEN 600 MG TABLET: 600 | 30 days supply | Qty: 90 | Fill #0

## 2017-06-09 NOTE — Progress Notes (Signed)
Subjective:  Patient ID: Nina Hawkins, female    DOB: 1971-07-28  Age: 46 y.o. MRN: [id number]  CC: back pain  HPI  Nina Hawkins is a 46 y.o. female with a PMH of chronic left sided LBP and left sided cervicalgia presents on f/u of back pain. Pain primarily in the left side of neck and left side of back. Initially attributed to prolonged standing at work for 14 hours per day. However, she has taken a break from work and feels the same pains. Unable to sleep on her left side due to pain. Was prescribed Naproxen which helped minimally with her pain. XR C spine revealed old trauma to the C3 vertebra body with mild remodeling. L spine XR revealed no abnormality. Pt reports severe back pain especially Does not endorse CP, palpitations, SOB, HA, abdominal pain, tingling, numbness, weakness, f/c/n/v, rash, or GI/GU sxs.    Outpatient Medications Prior to Visit  Medication Sig Dispense Refill  . cyclobenzaprine (FLEXERIL) 10 MG tablet Take 1 tablet (10 mg total) by mouth at bedtime. 14 tablet 0  . Hyoscyamine Sulfate SL (LEVSIN/SL) 0.125 MG SUBL Place 1 tablet under the tongue every morning. 30 each 0  . MEGARED OMEGA-3 KRILL OIL 500 MG CAPS Take 2 capsules by mouth daily. 60 capsule 5  . naproxen (NAPROSYN) 500 MG tablet Take 1 tablet (500 mg total) by mouth 2 (two) times daily with a meal. 30 tablet 0  . omeprazole (PRILOSEC) 40 MG capsule Take 1 capsule (40 mg total) by mouth daily. 30 capsule 3   No facility-administered medications prior to visit.      ROS Review of Systems  Constitutional: Negative for chills, fever and malaise/fatigue.  Eyes: Negative for blurred vision.  Respiratory: Negative for shortness of breath.   Cardiovascular: Negative for chest pain and palpitations.  Gastrointestinal: Negative for abdominal pain and nausea.  Genitourinary: Negative for dysuria and hematuria.  Musculoskeletal: Positive for back pain and neck pain. Negative for joint pain and myalgias.   Skin: Negative for rash.  Neurological: Negative for tingling and headaches.  Psychiatric/Behavioral: Negative for depression. The patient is not nervous/anxious.     Objective:  BP 112/73 (BP Location: Left Arm, Patient Position: Sitting, Cuff Size: Large)   Pulse 77   Temp 97.8 F (36.6 C) (Oral)   Ht 4' 9.87" (1.47 m)   Wt 180 lb 3.2 oz (81.7 kg)   SpO2 97%   BMI 37.83 kg/m   BP/Weight 06/09/2017 05/21/2017 03/23/2017  Systolic BP 112 132 150  Diastolic BP 73 87 100  Wt. (Lbs) 180.2 180.4 -  BMI 37.83 37.87 -      Physical Exam  Constitutional: She is oriented to person, place, and time.  Well developed, obese, NAD, polite  Eyes: No scleral icterus.  Neurological: She is alert and oriented to person, place, and time.  Psychiatric: She has a normal mood and affect. Her behavior is normal. Thought content normal.  Vitals reviewed.    Assessment & Plan:    1. Cervicalgia - Ambulatory referral to Physical Therapy - methocarbamol (ROBAXIN) 500 MG tablet; Take 1 tablet (500 mg total) by mouth 3 (three) times daily.  Dispense: 30 tablet; Refill: 2 - ibuprofen (ADVIL,MOTRIN) 600 MG tablet; Take 1 tablet (600 mg total) by mouth every 8 (eight) hours as needed.  Dispense: 90 tablet; Refill: 2  2. Chronic left-sided low back pain without sciatica - Ambulatory referral to Physical Therapy - methocarbamol (ROBAXIN) 500 MG tablet; Take 1 tablet (  500 mg total) by mouth 3 (three) times daily.  Dispense: 30 tablet; Refill: 2 - ibuprofen (ADVIL,MOTRIN) 600 MG tablet; Take 1 tablet (600 mg total) by mouth every 8 (eight) hours as needed.  Dispense: 90 tablet; Refill: 2  3. Hyperglycemia - HgB A1c 6.4% in clinic today.  4. Prediabetes - counseled patient on the importance of diet and exercise.   * Patient said she does not know what her labs from previous visits mean despite having been explained results in f/u appointments. I went through all her labs again and  imaging.   Meds ordered this encounter  Medications  . methocarbamol (ROBAXIN) 500 MG tablet    Sig: Take 1 tablet (500 mg total) by mouth 3 (three) times daily.    Dispense:  30 tablet    Refill:  2    Order Specific Question:   Supervising Provider    Answer:   Quentin Angst [id number]  . ibuprofen (ADVIL,MOTRIN) 600 MG tablet    Sig: Take 1 tablet (600 mg total) by mouth every 8 (eight) hours as needed.    Dispense:  90 tablet    Refill:  2    Order Specific Question:   Supervising Provider    Answer:   Quentin Angst [id number]    Follow-up: Return if symptoms worsen or fail to improve.   Loletta Specter PA

## 2017-06-09 NOTE — Patient Instructions (Addendum)
Muscle Strain A muscle strain is an injury that occurs when a muscle is stretched beyond its normal length. Usually a small number of muscle fibers are torn when this happens. Muscle strain is rated in degrees. First-degree strains have the least amount of muscle fiber tearing and pain. Second-degree and third-degree strains have increasingly more tearing and pain. Usually, recovery from muscle strain takes 1-2 weeks. Complete healing takes 5-6 weeks. What are the causes? Muscle strain happens when a sudden, violent force placed on a muscle stretches it too far. This may occur with lifting, sports, or a fall. What increases the risk? Muscle strain is especially common in athletes. What are the signs or symptoms? At the site of the muscle strain, there may be:  Pain.  Bruising.  Swelling.  Difficulty using the muscle due to pain or lack of normal function.  How is this diagnosed? Your health care provider will perform a physical exam and ask about your medical history. How is this treated? Often, the best treatment for a muscle strain is resting, icing, and applying cold compresses to the injured area. Follow these instructions at home:  Use the PRICE method of treatment to promote muscle healing during the first 2-3 days after your injury. The PRICE method involves: ? Protecting the muscle from being injured again. ? Restricting your activity and resting the injured body part. ? Icing your injury. To do this, put ice in a plastic bag. Place a towel between your skin and the bag. Then, apply the ice and leave it on from 15-20 minutes each hour. After the third day, switch to moist heat packs. ? Apply compression to the injured area with a splint or elastic bandage. Be careful not to wrap it too tightly. This may interfere with blood circulation or increase swelling. ? Elevate the injured body part above the level of your heart as often as you can.  Only take over-the-counter or  prescription medicines for pain, discomfort, or fever as directed by your health care provider.  Warming up prior to exercise helps to prevent future muscle strains. Contact a health care provider if:  You have increasing pain or swelling in the injured area.  You have numbness, tingling, or a significant loss of strength in the injured area. This information is not intended to replace advice given to you by your health care provider. Make sure you discuss any questions you have with your health care provider. Document Released: 09/28/2005 Document Revised: 03/05/2016 Document Reviewed: 04/27/2013 Elsevier Interactive Patient Education  2017 Elsevier Inc.  Prediabetes (Prediabetes) QU ES LA PREDIABETES? La prediabetes es la enfermedad que presenta un nivel de azcar en la sangre (glucemia) ms alto de lo normal, pero no lo suficientemente alto como para que le diagnostiquen diabetes tipo2. El hecho de ser prediabtico lo pone en riesgo de desarrollar diabetes tipo2 (diabetes mellitus tipo2). La prediabetes tambin se puede llamar intolerancia a la glucosa o glucosa alterada en ayunas. Generalmente, la prediabetes no causa sntomas. El mdico puede diagnosticar esta enfermedad por los anlisis de [place]. Los anlisis para Engineer, manufacturingdetectar la prediabetes se pueden realizar si usted tiene sobrepeso y si presenta al menos un factor de riesgo ms de prediabetes. Entre los factores de riesgo de prediabetes, se incluyen los siguientes:  Warehouse managerTener un familiar con diabetes tipo2.  Sobrepeso u obesidad.  Tener ms de 45 aos.  Ser descendiente de indgenas norteamericanos, afroamericanos, hispanos o latinos, o asiticos o isleos del Pacfico.  Tener un estilo de vida inactivo (  sedentario).  Tener antecedentes de diabetes gestacional o sndrome de ovario poliqustico (SOP).  Tener niveles bajos del colesterol bueno (HDL-C) o niveles altos de grasas en la sangre (triglicridos).  Tener hipertensin  arterial. QU ES LA GLUCEMIA Y CMO SE MIDE? La glucemia hace referencia a la cantidad de glucosa que tiene en el torrente sanguneo. La glucosa proviene de los alimentos que contienen azcar y almidn (carbohidratos) que el organismo descompone para formar glucosa. El nivel de glucemia se puede medir en mg/dl (miligramos por decilitro) o mmol/l (milimoles por litro).La glucemia puede controlarse con uno o ms de los siguientes anlisis de sangre:  Medicin de la glucemia en [place]. No se le permitir comer (tendr que [employer]) durante al menos 8horas antes de que se tome una [place]. ? Un rango normal de glucemia en ayunas es de 70 a 100mg /dl (de 3,9 a 1,6XWRU/E).  Un anlisis de sangre de A1c (hemoglobina A1c). Este anlisis proporciona informacin sobre el control de la glucemia durante los ltimos 2 o .  Prueba de tolerancia a la glucosa oral (PTGO). Esta prueba mide la glucemia dos veces: ? Despus del ayuno. Este es el valor inicial. ? Dos horas despus de ingerir una bebida que contiene glucosa. Pueden diagnosticarle prediabetes en los siguientes casos:  Si la glucemia en ayunas es de 100 a 125mg /dl (de 5,6 a 4,5WUJW/J).  Si el nivel de A1c es del 5,7% al 6,4%.  Si el resultado de la PTGO es de 140 a 199mg /dl (de 7,8 a 19JYNW/G). Estos anlisis de sangre se pueden repetir para Pharmacist, hospital diagnstico. QU SUCEDE SI LA GLUCEMIA ES DEMASIADO ALTA? El pncreas produce una hormona (insulina) que ayuda a Merchant navy officer glucosa desde el torrente sanguneo hacia las clulas. Cuando las clulas no responden de forma [place] a la insulina que el organismo produce (resistencia a la insulina), el exceso de glucosa se acumula en la sangre en vez de dirigirse hacia las clulas. Como consecuencia, se puede desarrollar glucemia alta (hiperglucemia), que puede causar muchas complicaciones. Este es uno de los sntomas de la prediabetes. QU PUEDE SUCEDER SI LA GLUCEMIA  PERMANECE MS ALTA DE LO NORMAL DURANTE MUCHO TIEMPO? Es peligroso Public librarian glucemia alta durante mucho tiempo. Demasiada glucosa en la sangre puede daar los nervios y los vasos sanguneos. El dao a largo plazo puede provocar complicaciones de la diabetes, por ejemplo:  Cardiopata.  Ictus.  Ceguera.  Enfermedad renal.  Depresin.  Mala circulacin en los pies y en las piernas, que podra llevar a la extraccin quirrgica (amputacin) en casos graves. CMO SE PUEDE EVITAR QUE LA PREDIABETES SE CONVIERTA EN DIABETES TIPO2? Para prevenir la diabetes tipo2, tome las siguientes medidas:  Haga actividad fsica. ? Haga actividad fsica de intensidad moderada durante al menos como mnimo 5das por [employer], o tanto como le haya indicado el mdico. Podra hacer caminatas dinmicas, ciclismo o [place]. ? Pregntele al mdico qu actividades son seguras para usted. Una combinacin de actividades puede ser la mejor opcin, por ejemplo, caminar, practicar natacin, andar en bicicleta y hacer entrenamiento de fuerza.  Baje de [employer] se lo haya indicado el mdico. ? Bajar entre el 5% y el 7% del peso corporal puede revertir la resistencia a la insulina. ? El mdico puede determinar cuntos kilos tiene que bajar y [place] a que adelgace de Wellsite geologist segura.  Siga un plan de alimentacin saludable. Este incluye consumir protenas magras, hidratos de carbono complejos, frutas y verduras frescas, productos lcteos con  bajo contenido de [place] y grasas saludables. ? Siga las indicaciones del mdico respecto de las restricciones para las comidas o las bebidas. ? Programe una cita con un especialista en alimentacin y nutricin (nutricionista certificado) para que lo ayude a Quarry manager plan de alimentacin saludable adecuado para usted.  No fume ni consuma ningn producto que contenga tabaco, lo que incluye cigarrillos, tabaco de Theatre manager y Administrator, Civil Service. Si necesita ayuda  para dejar de fumar, consulte al [employer].  [employer] de venta libre y los recetados como se lo haya indicado el mdico. Es posible que le receten medicamentos que ayuden a disminuir el riesgo de tener diabetes tipo2. Esta informacin no tiene Theme park manager el consejo del mdico. Asegrese de hacerle al mdico cualquier pregunta que tenga. Document Released: 11/19/2015 Document Revised: 11/19/2015 Document Reviewed: 11/19/2015 Elsevier Interactive Patient Education  [employer].

## 2017-06-16 ENCOUNTER — Telehealth (INDEPENDENT_AMBULATORY_CARE_PROVIDER_SITE_OTHER): Payer: Self-pay | Admitting: Physician Assistant

## 2017-06-16 NOTE — Telephone Encounter (Signed)
Please advise on this addendum to a previous letter.

## 2017-06-16 NOTE — Telephone Encounter (Signed)
Patient called regarding the letter that PA Lily KocherGomez did for her she wanted to know if is possible  That he can made another letter saying that she can be on light duty for 3 months because her supervisor don't respect that and will be until she finished her therapies and looking for another job  Thank You .

## 2017-06-17 NOTE — Telephone Encounter (Signed)
Pt can not be written off of work for that long with the current medical information we have now. It is up to her and her employer if she continues to work there. I recommend that she go to physical therapy. I have already put in the referral order for PT.

## 2017-07-06 ENCOUNTER — Ambulatory Visit: Payer: Self-pay | Attending: Physician Assistant | Admitting: Physical Therapy

## 2017-07-06 DIAGNOSIS — M542 Cervicalgia: Secondary | ICD-10-CM | POA: Insufficient documentation

## 2017-07-06 DIAGNOSIS — G8929 Other chronic pain: Secondary | ICD-10-CM | POA: Insufficient documentation

## 2017-07-06 DIAGNOSIS — M5442 Lumbago with sciatica, left side: Secondary | ICD-10-CM | POA: Insufficient documentation

## 2017-07-06 DIAGNOSIS — M6281 Muscle weakness (generalized): Secondary | ICD-10-CM | POA: Insufficient documentation

## 2017-07-06 DIAGNOSIS — M62838 Other muscle spasm: Secondary | ICD-10-CM | POA: Insufficient documentation

## 2017-07-07 NOTE — Therapy (Signed)
[facility] [address] [place], [place], [address] Phone: [phone]   Fax:  [phone]  Physical Therapy Evaluation  Patient Details  Name: Nina Hawkins MRN: [id number] Date of Birth: 1971/02/17 Referring Provider: Loletta Specter   Encounter Date: 07/06/2017      PT End of Session - 07/06/17 1651    Visit Number 1   Number of Visits 16   Date for PT Re-Evaluation 09/01/17   Authorization Type CAFA    PT Start Time 1630   PT Stop Time 1715   PT Time Calculation (min) 45 min      No past medical history on file.  Past Surgical History:  Procedure Laterality Date  . ABDOMINAL HYSTERECTOMY      There were no vitals filed for this visit.       Subjective Assessment - 07/06/17 1636    Subjective Patient wsas lifting a heavey object 6 months ago was lifting a box at work and she began to have neck and back pain. She had no prior history of neck and back pain. She has to stand for lon gperiods of time at work. She also has to use her hands for activity.    Limitations Standing;Walking;Lifting   How long can you stand comfortably? Has pain after a few hours at work    How long can you walk comfortably? Has some pain when walking community distances    Diagnostic tests X-ray: cervical Old C3 injury; Lumbar: No arthropathy; No fracture    Currently in Pain? Yes   Pain Score 4   Pain can reach a 9/10    Pain Location Neck   Pain Orientation Left   Pain Descriptors / Indicators Aching   Pain Type Chronic pain   Pain Radiating Towards can radiate into the head    Pain Onset More than a month ago   Pain Frequency Constant   Aggravating Factors  using a knife at work.    Pain Relieving Factors rest;    Effect of Pain on Daily Activities ibuprofin     Multiple Pain Sites Yes   Pain Score 6   Pain Location Back  Pain can reach an 8 or 9    Pain Orientation Left   Pain Descriptors / Indicators Aching   Pain  Radiating Towards radiates all the way to her foot.    Pain Onset More than a month ago   Pain Frequency Constant   Aggravating Factors  Standing at work    Pain Relieving Factors Ibuprofin for the back   Effect of Pain on Daily Activities Difficulty perfroming ADL's and IADL's             [facility] PT Assessment - 07/07/17 0001      Assessment   Medical Diagnosis Low back pain/. Cervical spine pain    Referring Provider Loletta Specter    Onset Date/Surgical Date --  6 months prior    Hand Dominance Right   Next MD Visit Nothing scheduled    Prior Therapy None      Precautions   Precautions None     Restrictions   Weight Bearing Restrictions No     Balance Screen   Has the patient fallen in the past 6 months No   Has the patient had a decrease in activity level because of a fear of falling?  No   Is the patient reluctant to leave their home because of a fear of falling?  No     Home Environment   Additional Comments nothing significant      Prior Function   Level of Independence Independent   Vocation Full time employment   Vocation Requirements works 14 hour days. Has to hold a heavey knife.    Leisure No steps in the house      Cognition   Overall Cognitive Status Within Functional Limits for tasks assessed   Attention Focused   Focused Attention Appears intact   Memory Appears intact   Awareness Appears intact   Problem Solving Appears intact     Sensation   Light Touch Appears Intact   Additional Comments Numbness in the left foot. Also has some numbness in the right foot at times. It mainly happens when she is doing the exercise bike at the gym.      Coordination   Gross Motor Movements are Fluid and Coordinated Yes   Fine Motor Movements are Fluid and Coordinated Yes     AROM   Cervical Flexion 20 with pain    Cervical Extension 38 with pain    Cervical - Right Side Bend full rnage but painful    Cervical - Left Side Bend full range but painful     Cervical - Right Rotation 28   Cervical - Left Rotation 30   Lumbar Flexion 25% limited with pain    Lumbar Extension No limitation but painful    Lumbar - Right Side Bend Pain on the left    Lumbar - Left Side Bend Pain on the left    Lumbar - Right Rotation Pain with right rotation on the right    Lumbar - Left Rotation Pain on the left      PROM   Overall PROM Comments pain with end range passive hip flexion and end range passive left hip IR      Strength   Overall Strength Comments right 5/5    Left Shoulder Flexion 4+/5   Left Shoulder ABduction 4/5   Left Shoulder Internal Rotation 5/5   Left Shoulder External Rotation 4+/5   Right/Left Hip Left   Left Hip Flexion 4+/5   Left Hip ABduction 4+/5   Left Hip ADduction 4+/5   Right/Left Knee Left   Left Knee Flexion 5/5   Left Knee Extension 5/5     Palpation   Palpation comment spasming of bilateral upper traps and left sided lumbar paraspinals      Ambulation/Gait   Gait Comments decreased hip rotation; decreased hip flexion;             Objective measurements completed on examination: See above findings.          [facility] Adult PT Treatment/Exercise - 07/07/17 0001      Neck Exercises: Standing   Other Standing Exercises standing cervical rotation 3x with cuing not to push through the pain      Lumbar Exercises: Stretches   Active Hamstring Stretch Limitations seated 2x20 sec    Lower Trunk Rotation Limitations x10    Piriformis Stretch Limitations seated for work 2x20 sec hold      Neck Exercises: Stretches   Upper Trapezius Stretch Limitations 2x20 sec hold mod cuing     Levator Stretch Limitations 2x20 sec hold mod cuing                 PT Education - 07/06/17 1650    Education provided Yes   Education Details reviewed HEP and symptom mangement  Person(s) Educated Patient   Methods Explanation;Demonstration;Tactile cues;Verbal cues   Comprehension Verbalized understanding;Returned  demonstration;Verbal cues required;Tactile cues required;Need further instruction          PT Short Term Goals - 07/07/17 0906      PT SHORT TERM GOAL #1   Title Patient will demsotrate a good core contraction    Time 4   Period Weeks   Status New   Target Date 08/04/17     PT SHORT TERM GOAL #2   Title Patient will increase bilateral cervical rotation by 20 degress bilateral    Time 4   Period Weeks   Status New   Target Date 08/04/17     PT SHORT TERM GOAL #3   Title Patient will demsotrate full pain freee left hip flexion and IR    Time 4   Period Weeks   Status New   Target Date 08/04/17     PT SHORT TERM GOAL #4   Title Patient will be independent with inital HEP.    Time 4   Period Weeks   Status New   Target Date 08/04/17           PT Long Term Goals - 07/07/17 0908      PT LONG TERM GOAL #1   Title Patient will demsotrate 65 degrees of bilateral cervical rotation in order to improve safety driving    Time 8   Period Weeks   Status New   Target Date 09/01/17     PT LONG TERM GOAL #2   Title Patient will put a 2lb weight in overhead cabinet without pain in order to eprfrom work tasks.    Time 8   Period Weeks   Status New     PT LONG TERM GOAL #3   Title Patient will be independent with HEP to promote further strengthening    Time 8   Period Weeks   Status New     PT LONG TERM GOAL #4   Title Patient will demsotrate a 45% limitation on FOTO    Time 8   Period Weeks   Status New   Target Date 09/01/17                Plan - 07/07/17 0912    Clinical Impression Statement Patient is a 46 year old female with left sided neck and back pain. She has limited cervical rotation. She has minor left arm weakness. She has spasming in her upper traps and her left side cervical paraspinals. She is working 14 hour days at this time. She has pain with hip flexion and hip internal rotation. She would benefit from skilled therapy to reduce muslce  spasming and pain in order to improve her ability to work.    History and Personal Factors relevant to plan of care: none   Clinical Presentation Evolving   Clinical Presentation due to: increasing pain in her neck and back    Clinical Decision Making Low   Rehab Potential Good   PT Frequency 2x / week   PT Duration 8 weeks   PT Treatment/Interventions ADLs/Self Care Home Management;Electrical Stimulation;Cryotherapy;Iontophoresis /ml Dexamethasone;Gait training;Stair training;Therapeutic activities;Therapeutic exercise;Ultrasound;Moist Heat;Neuromuscular re-education;Cognitive remediation;Patient/family education;Passive range of motion;Manual techniques;Dry needling;Taping   PT Next Visit Plan manual therapy to neck and back; begin light core strengthening; postural correction exercises. Patient advised therapy can not write her a work note.    PT Home Exercise Plan cervical rotation 2-3x; levator stretch, upper trap stretch; single  knee to ches stretch; seated hamstring stretch; lateral trunk rotation   Consulted and Agree with Plan of Care Patient      Patient will benefit from skilled therapeutic intervention in order to improve the following deficits and impairments:  Decreased activity tolerance, Decreased strength, Postural dysfunction, Pain, Increased muscle spasms, Impaired UE functional use, Difficulty walking, Decreased range of motion, Abnormal gait, Increased fascial restricitons  Visit Diagnosis: Cervicalgia - Plan: PT plan of care cert/re-cert  Chronic left-sided low back pain with left-sided sciatica - Plan: PT plan of care cert/re-cert  Other muscle spasm - Plan: PT plan of care cert/re-cert  Muscle weakness (generalized) - Plan: PT plan of care cert/re-cert     Problem List Patient Active Problem List   Diagnosis Date Noted  . H. pylori infection 02/03/2017  . Cervicalgia 02/02/2017  . Abdominal pain, epigastric 02/02/2017  . Chronic left-sided low back pain  without sciatica 02/02/2017    Dessie Coma 07/07/2017, 9:29 AM  [facility] [address] [place], [place], [address] Phone: [phone]   Fax:  [phone]  Name: Nina Hawkins MRN: [id number] Date of Birth: 14-Oct-1970

## 2017-07-12 ENCOUNTER — Ambulatory Visit: Payer: Self-pay | Attending: Physician Assistant | Admitting: Physical Therapy

## 2017-07-12 DIAGNOSIS — M5442 Lumbago with sciatica, left side: Secondary | ICD-10-CM | POA: Insufficient documentation

## 2017-07-12 DIAGNOSIS — M6281 Muscle weakness (generalized): Secondary | ICD-10-CM | POA: Insufficient documentation

## 2017-07-12 DIAGNOSIS — G8929 Other chronic pain: Secondary | ICD-10-CM | POA: Insufficient documentation

## 2017-07-12 DIAGNOSIS — M542 Cervicalgia: Secondary | ICD-10-CM | POA: Insufficient documentation

## 2017-07-12 DIAGNOSIS — M62838 Other muscle spasm: Secondary | ICD-10-CM | POA: Insufficient documentation

## 2017-07-15 ENCOUNTER — Ambulatory Visit: Payer: Self-pay | Admitting: Physical Therapy

## 2017-07-15 DIAGNOSIS — M5442 Lumbago with sciatica, left side: Secondary | ICD-10-CM

## 2017-07-15 DIAGNOSIS — M62838 Other muscle spasm: Secondary | ICD-10-CM

## 2017-07-15 DIAGNOSIS — M6281 Muscle weakness (generalized): Secondary | ICD-10-CM

## 2017-07-15 DIAGNOSIS — M542 Cervicalgia: Secondary | ICD-10-CM

## 2017-07-15 DIAGNOSIS — G8929 Other chronic pain: Secondary | ICD-10-CM

## 2017-07-15 NOTE — Therapy (Signed)
[facility] [address] [place], [place], [address] Phone: [phone]   Fax:  [phone]  Physical Therapy Treatment  Patient Details  Name: Nina Hawkins MRN: [id number] Date of Birth: Jan 18, 1971 Referring Provider: Loletta Specter   Encounter Date: 07/15/2017      PT End of Session - 07/15/17 0723    Visit Number 2   Number of Visits 16   Authorization Type CAFA    PT Start Time 0717   PT Stop Time 0815   PT Time Calculation (min) 58 min      No past medical history on file.  Past Surgical History:  Procedure Laterality Date  . ABDOMINAL HYSTERECTOMY      There were no vitals filed for this visit.      Subjective Assessment - 07/15/17 0719    Subjective Pain is bad. I have done the exercises. They help but I have also had  more pain.    Currently in Pain? Yes   Pain Score 6    Pain Location Back  hip and neck    Pain Orientation Left;Mid;Lower   Pain Descriptors / Indicators Aching;Burning  stretching    Pain Radiating Towards left arm numb/burnin/ pain can radiate into head    Aggravating Factors  sleep, turning head, twisting    Pain Relieving Factors rest                         [facility] Adult PT Treatment/Exercise - 07/15/17 0001      Lumbar Exercises: Stretches   Single Knee to Chest Stretch Limitations 3 x 30 sec    Lower Trunk Rotation Limitations x10    Piriformis Stretch Limitations modified knee to opposite shoulder 2 x 30 sec each      Modalities   Modalities Moist Heat     Moist Heat Therapy   Number Minutes Moist Heat 15 Minutes   Moist Heat Location Cervical;Lumbar Spine     Manual Therapy   Manual Therapy Soft tissue mobilization   Soft tissue mobilization upper traps, sub occipitals, also 2 tennis balls at occiput for suboccipital realease      Neck Exercises: Stretches   Upper Trapezius Stretch Limitations 3 x 20 each   Levator Stretch Limitations 3 x 10 each     Other Neck Stretches cervical rotation x 3 each way    Other Neck Stretches seated scap squeeze 5 sec x 10                   PT Short Term Goals - 07/07/17 0906      PT SHORT TERM GOAL #1   Title Patient will demsotrate a good core contraction    Time 4   Period Weeks   Status New   Target Date 08/04/17     PT SHORT TERM GOAL #2   Title Patient will increase bilateral cervical rotation by 20 degress bilateral    Time 4   Period Weeks   Status New   Target Date 08/04/17     PT SHORT TERM GOAL #3   Title Patient will demsotrate full pain freee left hip flexion and IR    Time 4   Period Weeks   Status New   Target Date 08/04/17     PT SHORT TERM GOAL #4   Title Patient will be independent with inital HEP.    Time 4   Period Weeks   Status  New   Target Date 08/04/17           PT Long Term Goals - 07/07/17 0908      PT LONG TERM GOAL #1   Title Patient will demsotrate 65 degrees of bilateral cervical rotation in order to improve safety driving    Time 8   Period Weeks   Status New   Target Date 09/01/17     PT LONG TERM GOAL #2   Title Patient will put a 2lb weight in overhead cabinet without pain in order to eprfrom work tasks.    Time 8   Period Weeks   Status New     PT LONG TERM GOAL #3   Title Patient will be independent with HEP to promote further strengthening    Time 8   Period Weeks   Status New     PT LONG TERM GOAL #4   Title Patient will demsotrate a 45% limitation on FOTO    Time 8   Period Weeks   Status New   Target Date 09/01/17               Plan - 07/15/17 1610    Clinical Impression Statement Reviewed HEP and encourgaed gentle motions. Pt reduced hours at work to 8 hour days which is helping with her pain levels. Began soft tissue work to upper traps and sub occipital. Pt given tennis balls and instructed in self suboccipital release. Encouraged good posture and work and home.    PT Next Visit Plan manual  therapy to neck and back; begin light core strengthening; postural correction exercises. Patient advised therapy can not write her a work note.    PT Home Exercise Plan cervical rotation 2-3x; levator stretch, upper trap stretch; single knee to ches stretch; seated hamstring stretch; lateral trunk rotation      Patient will benefit from skilled therapeutic intervention in order to improve the following deficits and impairments:  Decreased activity tolerance, Decreased strength, Postural dysfunction, Pain, Increased muscle spasms, Impaired UE functional use, Difficulty walking, Decreased range of motion, Abnormal gait, Increased fascial restricitons  Visit Diagnosis: Cervicalgia  Chronic left-sided low back pain with left-sided sciatica  Other muscle spasm  Muscle weakness (generalized)     Problem List Patient Active Problem List   Diagnosis Date Noted  . H. pylori infection 02/03/2017  . Cervicalgia 02/02/2017  . Abdominal pain, epigastric 02/02/2017  . Chronic left-sided low back pain without sciatica 02/02/2017    Sherrie Mustache, PTA 07/15/2017, 8:11 AM  [facility] [address] [place], [place], [address] Phone: [phone]   Fax:  [phone]  Name: Nina Hawkins MRN: [id number] Date of Birth: [birth year]

## 2017-07-20 ENCOUNTER — Ambulatory Visit: Payer: Self-pay | Admitting: Physical Therapy

## 2017-07-22 ENCOUNTER — Encounter: Payer: Self-pay | Admitting: Physical Therapy

## 2017-07-22 ENCOUNTER — Ambulatory Visit: Payer: Self-pay | Admitting: Physical Therapy

## 2017-07-22 DIAGNOSIS — M6281 Muscle weakness (generalized): Secondary | ICD-10-CM

## 2017-07-22 DIAGNOSIS — M5442 Lumbago with sciatica, left side: Secondary | ICD-10-CM

## 2017-07-22 DIAGNOSIS — M542 Cervicalgia: Secondary | ICD-10-CM

## 2017-07-22 DIAGNOSIS — M62838 Other muscle spasm: Secondary | ICD-10-CM

## 2017-07-22 DIAGNOSIS — G8929 Other chronic pain: Secondary | ICD-10-CM

## 2017-07-22 NOTE — Therapy (Signed)
[facility] [address] [place], [place], [address] Phone: [phone]   Fax:  [phone]  Physical Therapy Treatment  Patient Details  Name: Nina Hawkins MRN: [id number] Date of Birth: 07/19/1971 Referring Provider: Loletta Hawkins   Encounter Date: 07/22/2017      PT End of Session - 07/22/17 1002    Visit Number 3   Number of Visits 16   Date for PT Re-Evaluation 09/01/17   PT Start Time 0732   PT Stop Time 0817   PT Time Calculation (min) 45 min   Activity Tolerance Patient tolerated treatment well   Behavior During Therapy [facility] for tasks assessed/performed      History reviewed. No pertinent past medical history.  Past Surgical History:  Procedure Laterality Date  . ABDOMINAL HYSTERECTOMY      There were no vitals filed for this visit.      Subjective Assessment - 07/22/17 0730    Subjective She gets a headach 3 days a week.  I am a little bit better with the headach.  She has been doing her exercises.       Patient is accompained by: Interpreter   Currently in Pain? Yes   Pain Score 5    Pain Location Back   Pain Orientation Left;Mid;Lower   Pain Descriptors / Indicators Aching;Burning   Pain Radiating Towards left arm head   Pain Frequency Constant   Aggravating Factors  sleeping. cooking   Pain Relieving Factors rest,  using the knife less (In the kitchen)   Effect of Pain on Daily Activities ibuprophen   Pain Score 6   Pain Location Neck   Pain Orientation Left   Pain Descriptors / Indicators Aching;Burning;Headache   Pain Radiating Towards into arm   Pain Frequency Intermittent   Aggravating Factors  turning head,  using arm a lot   Pain Relieving Factors ibuprophen   Effect of Pain on Daily Activities ADL difficult,  sleeping difficult                         [facility] Adult PT Treatment/Exercise - 07/22/17 0001      Neck Exercises: Standing   Neck Retraction 5 reps   Neck  Retraction Limitations heavy cues  also levater  and upper trap stretches.       Lumbar Exercises: Stretches   Double Knee to Chest Stretch 2 reps;10 seconds   Double Knee to Chest Stretch Limitations less pain   Lower Trunk Rotation 5 reps   Lower Trunk Rotation Limitations it feels like I am twisting,  cued to avoid pain,  burning noted to reduce with less stretch     Lumbar Exercises: Supine   Bridge Limitations 3 reps,  painful.     Moist Heat Therapy   Number Minutes Moist Heat 15 Minutes   Moist Heat Location Cervical;Lumbar Spine     Manual Therapy   Manual Therapy Soft tissue mobilization   Soft tissue mobilization neck , upper quadrant.  periscapular tender, light manual.,  upper pecs tender,  Passive stretches to neck,  Pecs shoulder,  tissue softened,  pain reduced.                  PT Education - 07/22/17 1001    Education provided Yes   Education Details how stretches can be helpful   Person(s) Educated Patient   Methods Explanation          PT Short  Term Goals - 07/07/17 0906      PT SHORT TERM GOAL #1   Title Patient will demsotrate a good core contraction    Time 4   Period Weeks   Status New   Target Date 08/04/17     PT SHORT TERM GOAL #2   Title Patient will increase bilateral cervical rotation by 20 degress bilateral    Time 4   Period Weeks   Status New   Target Date 08/04/17     PT SHORT TERM GOAL #3   Title Patient will demsotrate full pain freee left hip flexion and IR    Time 4   Period Weeks   Status New   Target Date 08/04/17     PT SHORT TERM GOAL #4   Title Patient will be independent with inital HEP.    Time 4   Period Weeks   Status New   Target Date 08/04/17           PT Long Term Goals - 07/07/17 0908      PT LONG TERM GOAL #1   Title Patient will demsotrate 65 degrees of bilateral cervical rotation in order to improve safety driving    Time 8   Period Weeks   Status New   Target Date 09/01/17     PT  LONG TERM GOAL #2   Title Patient will put a 2lb weight in overhead cabinet without pain in order to eprfrom work tasks.    Time 8   Period Weeks   Status New     PT LONG TERM GOAL #3   Title Patient will be independent with HEP to promote further strengthening    Time 8   Period Weeks   Status New     PT LONG TERM GOAL #4   Title Patient will demsotrate a 45% limitation on FOTO    Time 8   Period Weeks   Status New   Target Date 09/01/17               Plan - 07/22/17 1002    Clinical Impression Statement Continues to have pain.  education needed on how exercises are needed and necessary.  Patient woke 2  X lastnight with pain.  Patient has clicks in jaw with opening mouth.   PT Treatment/Interventions ADLs/Self Care Home Management;Electrical Stimulation;Cryotherapy;Iontophoresis /ml Dexamethasone;Gait training;Stair training;Therapeutic activities;Therapeutic exercise;Ultrasound;Moist Heat;Neuromuscular re-education;Cognitive remediation;Patient/family education;Passive range of motion;Manual techniques;Dry needling;Taping   PT Next Visit Plan manual therapy to neck and back; begin light core strengthening; postural correction exercises. Patient advised therapy can not write her a work note.    PT Home Exercise Plan cervical rotation 2-3x; levator stretch, upper trap stretch; single knee to ches stretch; seated hamstring stretch; lateral trunk rotation   Consulted and Agree with Plan of Care Patient      Patient will benefit from skilled therapeutic intervention in order to improve the following deficits and impairments:     Visit Diagnosis: Cervicalgia  Chronic left-sided low back pain with left-sided sciatica  Other muscle spasm  Muscle weakness (generalized)     Problem List Patient Active Problem List   Diagnosis Date Noted  . H. pylori infection 02/03/2017  . Cervicalgia 02/02/2017  . Abdominal pain, epigastric 02/02/2017  . Chronic left-sided low  back pain without sciatica 02/02/2017    Nina Hawkins PTA 07/22/2017, 10:04 AM  [facility] [address] [place], [place], [address] Phone: [phone]   Fax:  [phone]  Name: Nina Hawkins MRN: [id number] Date of Birth: September 30, 1971

## 2017-07-26 ENCOUNTER — Ambulatory Visit: Payer: Self-pay | Admitting: Physical Therapy

## 2017-07-26 DIAGNOSIS — M5442 Lumbago with sciatica, left side: Secondary | ICD-10-CM

## 2017-07-26 DIAGNOSIS — M62838 Other muscle spasm: Secondary | ICD-10-CM

## 2017-07-26 DIAGNOSIS — M6281 Muscle weakness (generalized): Secondary | ICD-10-CM

## 2017-07-26 DIAGNOSIS — M542 Cervicalgia: Secondary | ICD-10-CM

## 2017-07-26 DIAGNOSIS — G8929 Other chronic pain: Secondary | ICD-10-CM

## 2017-07-26 NOTE — Therapy (Signed)
[place], [place], [address] Phone: [phone]   Fax:  [phone]  Physical Therapy Treatment  Patient Details  Name: Nina Hawkins MRN: [id number] Date of Birth: 07-29-1971 Referring Provider: Clent Demark   Encounter Date: 07/26/2017      PT End of Session - 07/26/17 0733    Visit Number 4   Number of Visits 16   Date for PT Re-Evaluation 09/01/17   Authorization Type CAFA    PT Start Time 0715   PT Stop Time 0810   PT Time Calculation (min) 55 min      No past medical history on file.  Past Surgical History:  Procedure Laterality Date  . ABDOMINAL HYSTERECTOMY      There were no vitals filed for this visit.      Subjective Assessment - 07/26/17 0718    Subjective Always pain on the left side of the neck . Exercises are not helping much.    Currently in Pain? Yes   Pain Score 4    Pain Location Back   Pain Orientation Left   Aggravating Factors  moving alot    Pain Score 0  up to 4-5/10   Pain Location Back   Pain Orientation Left   Pain Descriptors / Indicators --  pulling             [facility] PT Assessment - 07/26/17 0001      AROM   Cervical Flexion 35   Cervical Extension 55   Cervical - Right Side Bend full range    Cervical - Left Side Bend full range    Cervical - Right Rotation 70  pain on left side of neck   Cervical - Left Rotation 70                     [facility] Adult PT Treatment/Exercise - 07/26/17 0001      Neck Exercises: Standing   Other Standing Exercises Red band row and shoulder extension bilateral 10 x 2 each + HEP      Lumbar Exercises: Stretches   Single Knee to Chest Stretch Limitations 3 x 30 sec    Lower Trunk Rotation 5 reps;10 seconds   Piriformis Stretch Limitations modified knee to opposite shoulder 2 x 30 sec each   added figure 4      Lumbar Exercises: Supine   Ab Set 5 reps   Clam 10 reps   Clam Limitations  bilateral and unilateral    Bent Knee Raise 10 reps   Bridge Limitations x 10      Manual Therapy   Manual Therapy Joint mobilization;Passive ROM   Joint Mobilization Long axis distraction LLE, P/A grade 3 left hip mobs    Passive ROM PROM bilateral hips with pain on left ER      Neck Exercises: Stretches   Upper Trapezius Stretch Limitations 3 x 20 each   Levator Stretch Limitations 3 x 10 each    Other Neck Stretches cervical rotation x 3 each way    Other Neck Stretches seated scap squeeze 5 sec x 10                 PT Education - 07/26/17 0755    Education provided Yes   Education Details HEP   Person(s) Educated Patient   Methods Explanation;Handout   Comprehension Verbalized understanding          PT Short Term Goals - 07/26/17  0740      PT SHORT TERM GOAL #1   Title Patient will demsotrate a good core contraction    Baseline began education   Time 4   Period Weeks   Status On-going     PT SHORT TERM GOAL #2   Title Patient will increase bilateral cervical rotation by 20 degress bilateral    Baseline 70 bilateral    Time 4   Status Achieved     PT SHORT TERM GOAL #3   Title Patient will demsotrate full pain freee left hip flexion and IR    Baseline no pain with these motions today passively    Time 4   Period Weeks   Status Achieved     PT SHORT TERM GOAL #4   Title Patient will be independent with inital HEP.    Baseline reports compliance    Time 4   Period Weeks   Status Achieved           PT Long Term Goals - 07/07/17 0908      PT LONG TERM GOAL #1   Title Patient will demsotrate 65 degrees of bilateral cervical rotation in order to improve safety driving    Time 8   Period Weeks   Status New   Target Date 09/01/17     PT LONG TERM GOAL #2   Title Patient will put a 2lb weight in overhead cabinet without pain in order to eprfrom work tasks.    Time 8   Period Weeks   Status New     PT LONG TERM GOAL #3   Title Patient  will be independent with HEP to promote further strengthening    Time 8   Period Weeks   Status New     PT LONG TERM GOAL #4   Title Patient will demsotrate a 45% limitation on FOTO    Time 8   Period Weeks   Status New   Target Date 09/01/17               Plan - 07/26/17 0815    Clinical Impression Statement Continued pain left neck and left low back. Reviewed HEP and added scapular band and figure 4 stretch. Hip motion improved with flexion and IR on the left however c/o pain with ER and noted left tightness. Added figure 4 stretch to HEP. Began transverse abdominal exercises. Cervical motions improved with pain with right rotation. STG# 2,3,4 met.    PT Next Visit Plan manual therapy to neck and back; repeat supine lumbar stab-update HEP postural correction exercises. Patient advised therapy can not write her a work note.    PT Home Exercise Plan cervical rotation 2-3x; levator stretch, upper trap stretch; single knee to ches stretch; seated hamstring stretch; lateral trunk rotation   Consulted and Agree with Plan of Care Patient      Patient will benefit from skilled therapeutic intervention in order to improve the following deficits and impairments:  Decreased activity tolerance, Decreased strength, Postural dysfunction, Pain, Increased muscle spasms, Impaired UE functional use, Difficulty walking, Decreased range of motion, Abnormal gait, Increased fascial restricitons  Visit Diagnosis: Cervicalgia  Chronic left-sided low back pain with left-sided sciatica  Other muscle spasm  Muscle weakness (generalized)     Problem List Patient Active Problem List   Diagnosis Date Noted  . H. pylori infection 02/03/2017  . Cervicalgia 02/02/2017  . Abdominal pain, epigastric 02/02/2017  . Chronic left-sided low back pain without sciatica 02/02/2017  Dorene Ar, [place] 07/26/2017, 8:21 AM  [facility] [address] [place], [place], [address] Phone: [phone]   Fax:  [phone]  Name: Nina Hawkins MRN: [id number] Date of Birth: Jun 26, 1971

## 2017-07-28 ENCOUNTER — Ambulatory Visit: Payer: Self-pay | Admitting: Physical Therapy

## 2017-07-28 DIAGNOSIS — G8929 Other chronic pain: Secondary | ICD-10-CM

## 2017-07-28 DIAGNOSIS — M6281 Muscle weakness (generalized): Secondary | ICD-10-CM

## 2017-07-28 DIAGNOSIS — M5442 Lumbago with sciatica, left side: Secondary | ICD-10-CM

## 2017-07-28 DIAGNOSIS — M542 Cervicalgia: Secondary | ICD-10-CM

## 2017-07-28 DIAGNOSIS — M62838 Other muscle spasm: Secondary | ICD-10-CM

## 2017-07-28 NOTE — Therapy (Signed)
[facility] [address] [place], [place], [address] Phone: [phone]   Fax:  [phone]  Physical Therapy Treatment  Patient Details  Name: Nina Hawkins MRN: [id number] Date of Birth: 1971-04-10 Referring Provider: Loletta Specter   Encounter Date: 07/28/2017      PT End of Session - 07/28/17 0725    Visit Number 5   Number of Visits 16   Date for PT Re-Evaluation 09/01/17   Authorization Type CAFA    PT Start Time 0717   PT Stop Time 0815   PT Time Calculation (min) 58 min      No past medical history on file.  Past Surgical History:  Procedure Laterality Date  . ABDOMINAL HYSTERECTOMY      There were no vitals filed for this visit.      Subjective Assessment - 07/28/17 0723    Subjective No Lumbar pain right now. Left upper back in shoulder area pain 4/10.    Currently in Pain? Yes   Pain Score 4    Pain Location Back   Pain Orientation Left;Upper   Pain Descriptors / Indicators Radiating                         [facility] Adult PT Treatment/Exercise - 07/28/17 0001      Neck Exercises: Standing   Other Standing Exercises Red band row and shoulder extension bilateral 10 x 2 each + HEP      Neck Exercises: Supine   Other Supine Exercise supine scap stab series red band  pullovers, horizontal abduction, ER , diagonals      Lumbar Exercises: Aerobic   Stationary Bike Nustep L 5 UE/LE x 5 minutes      Lumbar Exercises: Supine   Ab Set 5 reps   Clam 10 reps   Clam Limitations bilateral and unilateral    Bent Knee Raise 10 reps   Bridge Limitations x 10      Moist Heat Therapy   Number Minutes Moist Heat 15 Minutes   Moist Heat Location Cervical;Lumbar Spine     Manual Therapy   Soft tissue mobilization right thoriacic, right upper trap/levator with trigger point release      Neck Exercises: Stretches   Corner Stretch 3 reps;20 seconds   Other Neck Stretches rhomboid stretch at  pole   painful in left upper back                   PT Short Term Goals - 07/26/17 0740      PT SHORT TERM GOAL #1   Title Patient will demsotrate a good core contraction    Baseline began education   Time 4   Period Weeks   Status On-going     PT SHORT TERM GOAL #2   Title Patient will increase bilateral cervical rotation by 20 degress bilateral    Baseline 70 bilateral    Time 4   Status Achieved     PT SHORT TERM GOAL #3   Title Patient will demsotrate full pain freee left hip flexion and IR    Baseline no pain with these motions today passively    Time 4   Period Weeks   Status Achieved     PT SHORT TERM GOAL #4   Title Patient will be independent with inital HEP.    Baseline reports compliance    Time 4   Period Weeks   Status Achieved  PT Long Term Goals - 07/07/17 0908      PT LONG TERM GOAL #1   Title Patient will demsotrate 65 degrees of bilateral cervical rotation in order to improve safety driving    Time 8   Period Weeks   Status New   Target Date 09/01/17     PT LONG TERM GOAL #2   Title Patient will put a 2lb weight in overhead cabinet without pain in order to eprfrom work tasks.    Time 8   Period Weeks   Status New     PT LONG TERM GOAL #3   Title Patient will be independent with HEP to promote further strengthening    Time 8   Period Weeks   Status New     PT LONG TERM GOAL #4   Title Patient will demsotrate a 45% limitation on FOTO    Time 8   Period Weeks   Status New   Target Date 09/01/17               Plan - 07/28/17 0724    Clinical Impression Statement Interpreter late. Used phone interpreter for first 7 minutes of treatment. Pt reports lumbar pain was much better at work yesterday. SHe tried the theraband exercises with some pain with scapular rows. Supine scap stab tolerated well today. IASTM used seated to left thoracic and upper trap. HMP afterward to decrease tightness.    PT Next Visit Plan  manual therapy to neck and thoracic ;  repeat supine lumbar stab and supine scap stab-update HEP postural correction exercises. Patient advised therapy can not write her a work note.    PT Home Exercise Plan cervical rotation 2-3x; levator stretch, upper trap stretch; single knee to ches stretch; seated hamstring stretch; lateral trunk rotation   Consulted and Agree with Plan of Care Patient      Patient will benefit from skilled therapeutic intervention in order to improve the following deficits and impairments:  Decreased activity tolerance, Decreased strength, Postural dysfunction, Pain, Increased muscle spasms, Impaired UE functional use, Difficulty walking, Decreased range of motion, Abnormal gait, Increased fascial restricitons  Visit Diagnosis: Cervicalgia  Chronic left-sided low back pain with left-sided sciatica  Other muscle spasm  Muscle weakness (generalized)     Problem List Patient Active Problem List   Diagnosis Date Noted  . H. pylori infection 02/03/2017  . Cervicalgia 02/02/2017  . Abdominal pain, epigastric 02/02/2017  . Chronic left-sided low back pain without sciatica 02/02/2017    Sherrie MustacheDonoho, Jesson Foskey McGee, PTA 07/28/2017, 8:34 AM  [facility] [address] [place], [place], [address] Phone: [phone]   Fax:  [phone]  Name: Nina Hawkins MRN: [id number] Date of Birth: Aug 28, 1971

## 2017-08-03 ENCOUNTER — Ambulatory Visit: Payer: Self-pay | Admitting: Physical Therapy

## 2017-08-06 ENCOUNTER — Ambulatory Visit: Payer: Self-pay | Admitting: Physical Therapy

## 2017-08-06 DIAGNOSIS — M62838 Other muscle spasm: Secondary | ICD-10-CM

## 2017-08-06 DIAGNOSIS — M6281 Muscle weakness (generalized): Secondary | ICD-10-CM

## 2017-08-06 DIAGNOSIS — M5442 Lumbago with sciatica, left side: Secondary | ICD-10-CM

## 2017-08-06 DIAGNOSIS — M542 Cervicalgia: Secondary | ICD-10-CM

## 2017-08-06 DIAGNOSIS — G8929 Other chronic pain: Secondary | ICD-10-CM

## 2017-08-06 NOTE — Therapy (Signed)
[facility] [address] [place], [place], [address] Phone: [phone]   Fax:  [phone]  Physical Therapy Treatment  Patient Details  Name: Nina Hawkins MRN: [id number] Date of Birth: 1971/02/01 Referring Provider: Loletta Specter   Encounter Date: 08/06/2017      PT End of Session - 08/06/17 0725    Visit Number 6   Number of Visits 16   Date for PT Re-Evaluation 09/01/17   Authorization Type CAFA    PT Start Time 0715   PT Stop Time 0820   PT Time Calculation (min) 65 min      No past medical history on file.  Past Surgical History:  Procedure Laterality Date  . ABDOMINAL HYSTERECTOMY      There were no vitals filed for this visit.      Subjective Assessment - 08/06/17 0749    Subjective My neck and head are hurting today.    Currently in Pain? Yes   Pain Score 5    Pain Location Neck  and left side of head   Pain Orientation Left   Pain Descriptors / Indicators Aching  swollen   Pain Type Chronic pain   Pain Radiating Towards left arm, head    Aggravating Factors  moving left arm, pain comes and goes, wake up with pain some days    Pain Relieving Factors rest, use other arm.             [facility] PT Assessment - 08/06/17 0001      AROM   Cervical - Right Rotation 65   Cervical - Left Rotation 55     Strength   Strength Assessment Site Hand   Right/Left hand Right;Left   Right Hand Grip (lbs) 26,27,29  27.3   Left Hand Grip (lbs) 30,30, 24  28                     [facility] Adult PT Treatment/Exercise - 08/06/17 0001      Neck Exercises: Standing   Other Standing Exercises cabinet reach 1#, 2# each arm x 10, pain rated at 3/10 in neck when using left arm .   Other Standing Exercises Red band row and shoulder extension bilateral 10 x 2 each + HEP      Neck Exercises: Supine   Neck Retraction 10 reps   Neck Retraction Limitations added 10 arm circles, c/o increased headache.     Other Supine Exercise supine scap stab series red band  pullovers, horizontal abduction, ER , diagonals +HEP      Lumbar Exercises: Aerobic   Stationary Bike Nustep L 5 UE/LE x 7 minutes                 PT Education - 08/06/17 0819    Education provided Yes   Education Details HEP    Person(s) Educated Patient   Methods Explanation;Handout   Comprehension Verbalized understanding          PT Short Term Goals - 08/06/17 0829      PT SHORT TERM GOAL #1   Title Patient will demsotrate a good core contraction    Baseline began education   Time 4   Period Weeks   Status On-going     PT SHORT TERM GOAL #2   Title Patient will increase bilateral cervical rotation by 20 degress bilateral    Baseline 70 bilateral at best    Time 4   Period Weeks  Status Achieved     PT SHORT TERM GOAL #3   Title Patient will demsotrate full pain freee left hip flexion and IR    Baseline no pain with these motions today passively    Time 4   Period Weeks   Status Achieved     PT SHORT TERM GOAL #4   Title Patient will be independent with inital HEP.    Baseline reports compliance    Time 4   Period Weeks   Status Achieved           PT Long Term Goals - 08/06/17 1610      PT LONG TERM GOAL #1   Title Patient will demsotrate 65 degrees of bilateral cervical rotation in order to improve safety driving    Baseline varies, see objective measure    Period Weeks   Status On-going     PT LONG TERM GOAL #2   Title Patient will put a 2lb weight in overhead cabinet without pain in order to eprfrom work tasks.    Baseline 3/10 pain with 2# cabinet reach left   Time 8   Period Weeks   Status On-going     PT LONG TERM GOAL #3   Title Patient will be independent with HEP to promote further strengthening    Time 8   Period Weeks   Status On-going     PT LONG TERM GOAL #4   Title Patient will demsotrate a 45% limitation on FOTO    Time 8   Period Weeks   Status Unable to  assess               Plan - 08/06/17 0726    Clinical Impression Statement Interpreter late. Used phone interpreter for first 5 minutes of treatment. Discussed attendance policy and that all appointments have been canceled and she can make one appointmnet at a time going forward.  Pt reports left side neck and headache are 5/10 today. Lumbar pain less. She reports PT has helped a little. She reports left sided neck swelling and morning left eye swelling. She reports burning in left eye and sometimes eye feels numb. She has a  headache every other day affecting frontal, parietal and occipital on left side only. Reviewed Supine scap stab series as she has not tried this at home yet. Recommended pt follow up with MD due to lack of progress with neck pain and headache.  She plans to make an appointment.    PT Next Visit Plan did pt make a MD appointment? Review supine scap stab, add lumbar stab , postural exercises   PT Home Exercise Plan cervical rotation 2-3x; levator stretch, upper trap stretch; single knee to ches stretch; seated hamstring stretch; lateral trunk rotation; red band shoulder rows and extension, Supine scap stab    Consulted and Agree with Plan of Care Patient      Patient will benefit from skilled therapeutic intervention in order to improve the following deficits and impairments:  Decreased activity tolerance, Decreased strength, Postural dysfunction, Pain, Increased muscle spasms, Impaired UE functional use, Difficulty walking, Decreased range of motion, Abnormal gait, Increased fascial restricitons  Visit Diagnosis: Cervicalgia  Chronic left-sided low back pain with left-sided sciatica  Other muscle spasm  Muscle weakness (generalized)     Problem List Patient Active Problem List   Diagnosis Date Noted  . H. pylori infection 02/03/2017  . Cervicalgia 02/02/2017  . Abdominal pain, epigastric 02/02/2017  . Chronic left-sided low back pain  without sciatica  02/02/2017    Sherrie Mustacheonoho, Jessica McGee, PTA 08/06/2017, 8:34 AM  [facility] [address] [place], [place], [address] Phone: [phone]   Fax:  [phone]  Name: Nina Hawkins MRN: [id number] Date of Birth: 06-18-1971

## 2017-08-06 NOTE — Patient Instructions (Addendum)
Over Head Pull: Narrow Grip       En la espalda, las rodillas dobladas, los pies planos, la banda a travs de los muslos, los codos rectos pero relajados. Separa las manos (inicio). Manteniendo los codos rectos, Liberty Medialleve los brazos hacia arriba y Intelsobre la cabeza, las manos Indian Lakehacia el piso. Mantenga el tirn Ecolabfirme en la banda. Aguanta momentneamente. Regresa lentamente, manteniendo el tirn Linwoodfirme, de vuelta al inicio. Repita _10__ veces. Color de la banda ___R___   Side Pull: Double Arm   En la espalda, las rodillas dobladas, los pies planos. Brazos perpendiculares al cuerpo, al Bear Stearnsnivel de los hombros, codos rectos pero relajados. Tire de los Bank of New York Companybrazos hacia los lados, los codos rectos. La banda de resistencia Avnetatraviesa las clavculas, las manos hacia el suelo. Aguanta momentneamente. Lentamente regresa a la posicin inicial. Repita _10__ veces. Color de la banda __R_  Sash   En la espalda, las rodillas dobladas, los pies planos, la mano izquierda en la cadera izquierda, la mano derecha arriba a la izquierda. Tire del brazo derecho DIAGONALMENTE (cadera a hombro) Dealersobre el pecho. Lleva el brazo derecho a lo largo de la cabeza Lear Corporationhacia el piso. Aguanta momentneamente. Lentamente regresa a la posicin inicial. Repita _10_ veces. Hacer con el brazo izquierdo. Color de la banda __R____ Shoulder Rotation: Double Arm    En la espalda, las rodillas dobladas, los pies planos, los codos doblados a los lados, doblados 90 , las palmas de las manos Maltahacia arriba. Separe las manos y bjelas hacia el piso, manteniendo los codos cerca de los lados. Aguanta momentneamente. Lentamente regresa a la posicin inicial. Repita _10_ veces. Color de la banda __R____

## 2017-08-10 ENCOUNTER — Ambulatory Visit (INDEPENDENT_AMBULATORY_CARE_PROVIDER_SITE_OTHER): Payer: Self-pay | Admitting: Physician Assistant

## 2017-08-10 ENCOUNTER — Ambulatory Visit: Payer: Self-pay

## 2017-08-10 ENCOUNTER — Encounter (INDEPENDENT_AMBULATORY_CARE_PROVIDER_SITE_OTHER): Payer: Self-pay | Admitting: Physician Assistant

## 2017-08-10 VITALS — BP 130/84 | HR 69 | Temp 97.9°F | Wt 180.0 lb

## 2017-08-10 DIAGNOSIS — M62838 Other muscle spasm: Secondary | ICD-10-CM

## 2017-08-10 DIAGNOSIS — M542 Cervicalgia: Secondary | ICD-10-CM

## 2017-08-10 DIAGNOSIS — G8929 Other chronic pain: Secondary | ICD-10-CM

## 2017-08-10 DIAGNOSIS — M6281 Muscle weakness (generalized): Secondary | ICD-10-CM

## 2017-08-10 DIAGNOSIS — B9689 Other specified bacterial agents as the cause of diseases classified elsewhere: Secondary | ICD-10-CM

## 2017-08-10 DIAGNOSIS — M5442 Lumbago with sciatica, left side: Secondary | ICD-10-CM

## 2017-08-10 DIAGNOSIS — N760 Acute vaginitis: Secondary | ICD-10-CM

## 2017-08-10 MED ORDER — METRONIDAZOLE 500 MG PO TABS: 500.0000 mg | ORAL_TABLET | Freq: Two times a day (BID) | ORAL | 0 refills | Status: AC

## 2017-08-10 MED ORDER — ACETAMINOPHEN-CODEINE #3 300-30 MG PO TABS: 1.0000 | ORAL_TABLET | ORAL | 0 refills | Status: AC | PRN

## 2017-08-10 MED FILL — ACETAMINOPHEN/COD #3 TABLET: 300-30 | 7 days supply | Qty: 42 | Fill #0

## 2017-08-10 MED FILL — metroNIDAZOLE 500 MG TABS: 500 | 7 days supply | Qty: 14 | Fill #0

## 2017-08-10 NOTE — Therapy (Signed)
[facility] [address] [place], [place], [address] Phone: [phone]   Fax:  [phone]  Physical Therapy Treatment  Patient Details  Name: Nina Hawkins MRN: [id number] Date of Birth: 1971/04/11 Referring Provider: Loletta Specter   Encounter Date: 08/10/2017      PT End of Session - 08/10/17 0719    Visit Number 7   Number of Visits 16   Date for PT Re-Evaluation 09/01/17   Authorization Type CAFA    PT Start Time 0715  LATE   PT Stop Time 0805   PT Time Calculation (min) 50 min   Activity Tolerance Patient tolerated treatment well   Behavior During Therapy [facility] for tasks assessed/performed      No past medical history on file.  Past Surgical History:  Procedure Laterality Date  . ABDOMINAL HYSTERECTOMY      There were no vitals filed for this visit.      Subjective Assessment - 08/10/17 0712    Subjective No pain yesterday in leg.    Pain in leg today.   PAin is worse in head and neck .  pain going oin for several months. She reports no better overall though feels better after PT visits.  She reports she is sleeping bette only waking 1-2x/night.   Was 3-4 x/night.  She reports she feels the heat  helps the most.   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    Patient is accompained by: Interpreter   Pain Score 4    Pain Location Neck   Pain Orientation Left   Pain Descriptors / Indicators Aching   Pain Type Chronic pain   Pain Onset More than a month ago   Pain Frequency Constant   Aggravating Factors  using Lt arm,    Pain Relieving Factors rest, meds    Pain Score 4   Pain Location Leg   Pain Orientation Left   Pain Descriptors / Indicators Dull   Pain Type Chronic pain   Pain Onset More than a month ago   Pain Frequency Intermittent   Aggravating Factors  standing  for > 5-6 hours    Pain Relieving Factors meds                         [facility] Adult PT Treatment/Exercise -  08/10/17 0001      Self-Care   Self-Care Other Self-Care Comments   Other Self-Care Comments  Spent much time discussing progress and need to do HEP,  bying heating pad as this seems to be the one thing she can identify as helpful in PT      Lumbar Exercises: Aerobic   Stationary Bike Nustep L 5 UE/LE x 7 minutes      Moist Heat Therapy   Number Minutes Moist Heat 12 Minutes   Moist Heat Location Cervical;Lumbar Spine     Manual Therapy   Joint Mobilization Long axis distraction LLE,  leg pain eliminated    Soft tissue mobilization LT cervical and sub occipital release  with manual traction also.                 PT Education - 08/10/17 0749    Education provided Yes   Education Details benefit of home heating pad,   need to be making progress to contnue PT    Person(s) Educated Patient   Methods Explanation   Comprehension Verbalized understanding  PT Short Term Goals - 08/06/17 9562      PT SHORT TERM GOAL #1   Title Patient will demsotrate a good core contraction    Baseline began education   Time 4   Period Weeks   Status On-going     PT SHORT TERM GOAL #2   Title Patient will increase bilateral cervical rotation by 20 degress bilateral    Baseline 70 bilateral at best    Time 4   Period Weeks   Status Achieved     PT SHORT TERM GOAL #3   Title Patient will demsotrate full pain freee left hip flexion and IR    Baseline no pain with these motions today passively    Time 4   Period Weeks   Status Achieved     PT SHORT TERM GOAL #4   Title Patient will be independent with inital HEP.    Baseline reports compliance    Time 4   Period Weeks   Status Achieved           PT Long Term Goals - 08/06/17 1308      PT LONG TERM GOAL #1   Title Patient will demsotrate 65 degrees of bilateral cervical rotation in order to improve safety driving    Baseline varies, see objective measure    Period Weeks   Status On-going     PT LONG TERM GOAL  #2   Title Patient will put a 2lb weight in overhead cabinet without pain in order to eprfrom work tasks.    Baseline 3/10 pain with 2# cabinet reach left   Time 8   Period Weeks   Status On-going     PT LONG TERM GOAL #3   Title Patient will be independent with HEP to promote further strengthening    Time 8   Period Weeks   Status On-going     PT LONG TERM GOAL #4   Title Patient will demsotrate a 45% limitation on FOTO    Time 8   Period Weeks   Status Unable to assess               Plan - 08/10/17 0750    Clinical Impression Statement Discussed progress or lack of this and need to be improving to justfy more PT We will plan on continuing for 3-4 more weeks and see if she can improve.   Sleep is better she reports.   Her job does not allow for rest of arm or leg and is flexed at neck so  PT may not n long run make difference , may be time and home management   PT Frequency 2x / week   PT Duration 3 weeks   PT Treatment/Interventions ADLs/Self Care Home Management;Electrical Stimulation;Cryotherapy;Iontophoresis 4mg /ml Dexamethasone;Gait training;Stair training;Therapeutic activities;Therapeutic exercise;Ultrasound;Moist Heat;Neuromuscular re-education;Cognitive remediation;Patient/family education;Passive range of motion;Manual techniques;Dry needling;Taping   PT Next Visit Plan Contnue mannual and progress HEP   PT Home Exercise Plan cervical rotation 2-3x; levator stretch, upper trap stretch; single knee to ches stretch; seated hamstring stretch; lateral trunk rotation; red band shoulder rows and extension, Supine scap stab    Consulted and Agree with Plan of Care Patient      Patient will benefit from skilled therapeutic intervention in order to improve the following deficits and impairments:  Decreased activity tolerance, Decreased strength, Postural dysfunction, Pain, Increased muscle spasms, Impaired UE functional use, Difficulty walking, Decreased range of motion,  Abnormal gait, Increased fascial restricitons  Visit Diagnosis: Cervicalgia  Chronic left-sided low back pain with left-sided sciatica  Other muscle spasm  Muscle weakness (generalized)     Problem List Patient Active Problem List   Diagnosis Date Noted  . H. pylori infection 02/03/2017  . Cervicalgia 02/02/2017  . Abdominal pain, epigastric 02/02/2017  . Chronic left-sided low back pain without sciatica 02/02/2017    Caprice RedChasse, Britanni Yarde M  PT 08/10/2017, 7:54 AM  [facility] [address] [place], [place], [address] Phone: [phone]   Fax:  [phone]  Name: Nina Hawkins MRN: [id number] Date of Birth: June 12, 1971

## 2017-08-10 NOTE — Patient Instructions (Signed)
Vaginosis bacteriana (Bacterial Vaginosis) La vaginosis bacteriana es una infeccin vaginal que perturba el equilibrio normal de las bacterias que se encuentran en la vagina. Es el resultado de un crecimiento excesivo de ciertas bacterias. Esta es la infeccin vaginal ms frecuente en mujeres en edad reproductiva. El tratamiento es importante para prevenir complicaciones, especialmente en mujeres embarazadas, dado que puede causar un parto prematuro. CAUSAS La vaginosis bacteriana se origina por un aumento de bacterias nocivas que, generalmente, estn presentes en cantidades ms pequeas en la vagina. Varios tipos diferentes de bacterias pueden causar esta afeccin. Sin embargo, la causa de su desarrollo no se comprende totalmente. FACTORES DE RIESGO Ciertas actividades o comportamientos pueden exponerlo a un mayor riesgo de desarrollar vaginosis bacteriana, entre los que se incluyen:  Tener una nueva pareja sexual o mltiples parejas sexuales.  Las duchas vaginales  El uso del DIU (dispositivo intrauterino) como mtodo anticonceptivo. El contagio no se produce en baos, por ropas de cama, en piscinas o por contacto con objetos. SIGNOS Y SNTOMAS Algunas mujeres que padecen vaginosis bacteriana no presentan signos ni sntomas. Los sntomas ms comunes son:  Secrecin vaginal de color grisceo.  Secrecin vaginal con olor similar al pescado, especialmente despus de mantener relaciones sexuales.  Picazn o sensacin de ardor en la vagina o la vulva.  Ardor o dolor al orinar. DIAGNSTICO Su mdico analizar su historia clnica y le examinar la vagina para detectar signos de vaginosis bacteriana. Puede tomarle una muestra de flujo vaginal. Su mdico examinar esta muestra con un microscopio para controlar las bacterias y clulas anormales. Tambin puede realizarse un anlisis del pH vaginal. TRATAMIENTO La vaginosis bacteriana puede tratarse con antibiticos, en forma de comprimidos o de  crema vaginal. Puede indicarse una segunda tanda de antibiticos si la afeccin se repite despus del tratamiento. Debido a que la vaginosis bacteriana aumenta el riesgo de contraer enfermedades de transmisin sexual, el tratamiento puede ayudar a reducir el riesgo de clamidia, gonorrea, VIH y herpes. INSTRUCCIONES PARA EL CUIDADO EN EL HOGAR  Tome solo medicamentos de venta libre o recetados, segn las indicaciones del mdico.  Si le han recetado antibiticos, tmelos como se le indic. Asegrese de que finaliza la prescripcin completa aunque se sienta mejor.  Comunique a sus compaeros sexuales que sufre una infeccin vaginal. Deben consultar a su mdico y recibir tratamiento si tienen problemas, como picazn o una erupcin cutnea leve.  Durante el tratamiento, es importante que siga estas indicaciones:  Evite mantener relaciones sexuales o use preservativos de la forma correcta.  No se haga duchas vaginales.  Evite consumir alcohol como se lo haya indicado el mdico.  Evite amamantar como se lo haya indicado el mdico. SOLICITE ATENCIN MDICA SI:  Sus sntomas no mejoran despus de 3 das de tratamiento.  Aumenta la secrecin o el dolor.  Tiene fiebre. ASEGRESE DE QUE:  Comprende estas instrucciones.  Controlar su afeccin.  Recibir ayuda de inmediato si no mejora o si empeora. PARA OBTENER MS INFORMACIN Centros para el control y la prevencin de enfermedades (Centers for Disease Control and Prevention, CDC): www.cdc.gov/std Asociacin Estadounidense de la Salud Sexual (American Sexual Health Association, SHA): www.ashastd.org Esta informacin no tiene como fin reemplazar el consejo del mdico. Asegrese de hacerle al mdico cualquier pregunta que tenga. Document Released: 01/05/2008 Document Revised: 10/19/2014 Document Reviewed: 05/10/2013 Elsevier Interactive Patient Education  2017 Elsevier Inc.  

## 2017-08-10 NOTE — Progress Notes (Signed)
Subjective:  Patient ID: Nina Hawkins, female    DOB: 03/17/1971  Age: 46 y.o. MRN: [id number]  CC: cervicalgia  HPI Nina Gainna Martinezis a 46 y.o.femalewith a PMH of chronic left sided LBP and left sided cervicalgia presents on f/u on cervicalgia of the left side.Initially attributed to prolonged standing at work for 14 hours per day. However, she has taken a break from work and feels the same pains. Had been unable to sleep on her left side due to pain but has felt mild to moderate relief at night since going to physical therapy (PT). Numbness of left arm has also subsided significantly but not totally. Has been to seven PT sessions since 07/06/17.  Reports 40-50% relief of pain after PT sessions. Would like another letter written for work that would limit the amount of hours she works.     Inquires about the results and antibiotic sent approximately two months ago for BV. Says the pharmacy never gave her the metronidazole.    Outpatient Medications Prior to Visit  Medication Sig Dispense Refill  . ibuprofen (ADVIL,MOTRIN) 600 MG tablet Take 1 tablet (600 mg total) by mouth every 8 (eight) hours as needed. 90 tablet 2  . cyclobenzaprine (FLEXERIL) 10 MG tablet Take 1 tablet (10 mg total) by mouth at bedtime. (Patient not taking: Reported on 08/10/2017) 14 tablet 0  . Hyoscyamine Sulfate SL (LEVSIN/SL) 0.125 MG SUBL Place 1 tablet under the tongue every morning. (Patient not taking: Reported on 08/10/2017) 30 each 0  . MEGARED OMEGA-3 KRILL OIL 500 MG CAPS Take 2 capsules by mouth daily. (Patient not taking: Reported on 08/10/2017) 60 capsule 5  . methocarbamol (ROBAXIN) 500 MG tablet Take 1 tablet (500 mg total) by mouth 3 (three) times daily. (Patient not taking: Reported on 08/10/2017) 30 tablet 2  . naproxen (NAPROSYN) 500 MG tablet Take 1 tablet (500 mg total) by mouth 2 (two) times daily with a meal. (Patient not taking: Reported on 08/10/2017) 30 tablet 0  . omeprazole (PRILOSEC) 40 MG  capsule Take 1 capsule (40 mg total) by mouth daily. (Patient not taking: Reported on 08/10/2017) 30 capsule 3   No facility-administered medications prior to visit.      ROS Review of Systems  Constitutional: Negative for chills, fever and malaise/fatigue.  Eyes: Negative for blurred vision.  Respiratory: Negative for shortness of breath.   Cardiovascular: Negative for chest pain and palpitations.  Gastrointestinal: Negative for abdominal pain and nausea.  Genitourinary: Negative for dysuria and hematuria.  Musculoskeletal: Negative for joint pain and myalgias.  Skin: Negative for rash.  Neurological: Negative for tingling and headaches.  Psychiatric/Behavioral: Negative for depression. The patient is not nervous/anxious.     Objective:  BP 130/84 (BP Location: Left Arm, Patient Position: Sitting, Cuff Size: Large)   Pulse 69   Temp 97.9 F (36.6 C) (Oral)   Wt 180 lb (81.6 kg)   SpO2 96%   BMI 37.78 kg/m   BP/Weight 08/10/2017 06/09/2017 05/21/2017  Systolic BP 130 112 132  Diastolic BP 84 73 87  Wt. (Lbs) 180 180.2 180.4  BMI 37.78 37.83 37.87      Physical Exam  Constitutional: She is oriented to person, place, and time.  Well developed, overweight, NAD, polite  HENT:  Head: Normocephalic and atraumatic.  Eyes: No scleral icterus.  Neck: Normal range of motion.  Pulmonary/Chest: Effort normal and breath sounds normal.  Neurological: She is alert and oriented to person, place, and time. No cranial nerve deficit. Coordination normal.  Skin: Skin is warm and dry. No rash noted. No erythema. No pallor.  Psychiatric: She has a normal mood and affect. Her behavior is normal. Thought content normal.  Vitals reviewed.    Assessment & Plan:   1. Cervicalgia - CT CERVICAL SPINE WO CONTRAST; Future - Will make decision to continue with PT or send to orthopedics based on the CT.  - Letter written to limit the amount of work hours and duties as requested per  patient.  2. BV (bacterial vaginosis) - Pt states she failed to pick up Metronidazole when it was prescribed to her approximately two months ago.  - Begin metroNIDAZOLE (FLAGYL) 500 MG tablet; Take 1 tablet (500 mg total) by mouth 2 (two) times daily.  Dispense: 14 tablet; Refill: 0   Meds ordered this encounter  Medications  . metroNIDAZOLE (FLAGYL) 500 MG tablet    Sig: Take 1 tablet (500 mg total) by mouth 2 (two) times daily.    Dispense:  14 tablet    Refill:  0    Order Specific Question:   Supervising Provider    Answer:   Quentin Angst [id number]  . acetaminophen-codeine (TYLENOL #3) 300-30 MG tablet    Sig: Take 1 tablet by mouth every 4 (four) hours as needed for moderate pain.    Dispense:  42 tablet    Refill:  0    Order Specific Question:   Supervising Provider    Answer:   Quentin Angst [id number]    Follow-up: Return if symptoms worsen or fail to improve, for discuss chronic abdominal pains.   Loletta Specter PA

## 2017-08-12 ENCOUNTER — Encounter: Payer: Self-pay | Admitting: Physical Therapy

## 2017-08-12 ENCOUNTER — Ambulatory Visit: Payer: Self-pay | Attending: Physician Assistant | Admitting: Physical Therapy

## 2017-08-12 DIAGNOSIS — M5442 Lumbago with sciatica, left side: Secondary | ICD-10-CM | POA: Insufficient documentation

## 2017-08-12 DIAGNOSIS — G8929 Other chronic pain: Secondary | ICD-10-CM | POA: Insufficient documentation

## 2017-08-12 DIAGNOSIS — M6281 Muscle weakness (generalized): Secondary | ICD-10-CM | POA: Insufficient documentation

## 2017-08-12 DIAGNOSIS — M62838 Other muscle spasm: Secondary | ICD-10-CM | POA: Insufficient documentation

## 2017-08-12 DIAGNOSIS — M542 Cervicalgia: Secondary | ICD-10-CM | POA: Insufficient documentation

## 2017-08-12 NOTE — Therapy (Signed)
[facility] [address] [place], [place], [address] Phone: [phone]   Fax:  [phone]  Physical Therapy Treatment  Patient Details  Name: Nina Hawkins MRN: [id number] Date of Birth: Apr 24, 1971 Referring Provider: Loletta Specter   Encounter Date: 08/12/2017      PT End of Session - 08/12/17 0718    Visit Number 8   Number of Visits 16   Date for PT Re-Evaluation 09/01/17   Authorization Type CAFA    PT Start Time 0715   PT Stop Time 0815   PT Time Calculation (min) 60 min      No past medical history on file.  Past Surgical History:  Procedure Laterality Date  . ABDOMINAL HYSTERECTOMY      There were no vitals filed for this visit.      Subjective Assessment - 08/12/17 0718    Subjective I felt a little better after last time. I still have neck pain and headache.    Currently in Pain? Yes   Pain Score 4    Pain Location Neck  and head left side    Pain Orientation Left   Pain Descriptors / Indicators Aching            [facility] PT Assessment - 08/12/17 0001      Observation/Other Assessments   Focus on Therapeutic Outcomes (FOTO)  42% improved from 62% limited on eval                      [facility] Adult PT Treatment/Exercise - 08/12/17 0001      Neck Exercises: Standing   Other Standing Exercises Red band row and shoulder extension bilateral 10 x 2 each + HEP      Neck Exercises: Supine   Other Supine Exercise supine scap stab series red band  pullovers, horizontal abduction, ER , diagonals +HEP      Lumbar Exercises: Aerobic   Stationary Bike Nustep L 5 UE/LE x 5 minutes      Lumbar Exercises: Supine   Clam 10 reps   Clam Limitations unilateral    Bent Knee Raise 10 reps   Bridge Limitations x 10      Manual Therapy   Soft tissue mobilization sub occipital release, distraction   Passive ROM Cervical side bend and rotation                   PT Short Term Goals -  08/06/17 0829      PT SHORT TERM GOAL #1   Title Patient will demsotrate a good core contraction    Baseline began education   Time 4   Period Weeks   Status On-going     PT SHORT TERM GOAL #2   Title Patient will increase bilateral cervical rotation by 20 degress bilateral    Baseline 70 bilateral at best    Time 4   Period Weeks   Status Achieved     PT SHORT TERM GOAL #3   Title Patient will demsotrate full pain freee left hip flexion and IR    Baseline no pain with these motions today passively    Time 4   Period Weeks   Status Achieved     PT SHORT TERM GOAL #4   Title Patient will be independent with inital HEP.    Baseline reports compliance    Time 4   Period Weeks   Status Achieved  PT Long Term Goals - 08/06/17 0829      PT LONG TERM GOAL #1   Title Patient will demsotrate 65 degrees of bilateral cervical rotation in order to improve safety driving    Baseline varies, see objective measure    Period Weeks   Status On-going     PT LONG TERM GOAL #2   Title Patient will put a 2lb weight in overhead cabinet without pain in order to eprfrom work tasks.    Baseline 3/10 pain with 2# cabinet reach left   Time 8   Period Weeks   Status On-going     PT LONG TERM GOAL #3   Title Patient will be independent with HEP to promote further strengthening    Time 8   Period Weeks   Status On-going     PT LONG TERM GOAL #4   Title Patient will demsotrate a 45% limitation on FOTO    Time 8   Period Weeks   Status Unable to assess               Plan - 08/12/17 0800    Clinical Impression Statement Pt saw MD yesterday who recommended continued PT and ordered a cervical MRI. She reports neck pain and headache today. Worked on gentle scapular and core exercises. Manual for neck ROM and distraction. Pt felt better at end of treament. FOTO score improved to 42% limited which is improved from 60 % limited at evaluation.    PT Next Visit Plan Contnue  mannual and progress HEP, try Estim?    PT Home Exercise Plan cervical rotation 2-3x; levator stretch, upper trap stretch; single knee to ches stretch; seated hamstring stretch; lateral trunk rotation; red band shoulder rows and extension, Supine scap stab    Consulted and Agree with Plan of Care Patient      Patient will benefit from skilled therapeutic intervention in order to improve the following deficits and impairments:  Decreased activity tolerance, Decreased strength, Postural dysfunction, Pain, Increased muscle spasms, Impaired UE functional use, Difficulty walking, Decreased range of motion, Abnormal gait, Increased fascial restricitons  Visit Diagnosis: Cervicalgia  Chronic left-sided low back pain with left-sided sciatica  Other muscle spasm  Muscle weakness (generalized)     Problem List Patient Active Problem List   Diagnosis Date Noted  . H. pylori infection 02/03/2017  . Cervicalgia 02/02/2017  . Abdominal pain, epigastric 02/02/2017  . Chronic left-sided low back pain without sciatica 02/02/2017    Sherrie Mustacheonoho, Brayden Betters McGee, PTA 08/12/2017, 8:20 AM  [facility] [address] [place], [place], [id number] Phone: [phone]   Fax:  [phone]  Name: [name] MRN: [id number] Date of Birth: 25-Oct-1970

## 2017-08-16 ENCOUNTER — Ambulatory Visit (HOSPITAL_COMMUNITY)
Admission: RE | Admit: 2017-08-16 | Discharge: 2017-08-16 | Disposition: A | Discharge status: Home / Self Care | Payer: Self-pay | Source: Ambulatory Visit | Attending: Physician Assistant | Admitting: Physician Assistant

## 2017-08-16 DIAGNOSIS — M542 Cervicalgia: Secondary | ICD-10-CM | POA: Insufficient documentation

## 2017-08-16 DIAGNOSIS — M47812 Spondylosis without myelopathy or radiculopathy, cervical region: Secondary | ICD-10-CM | POA: Insufficient documentation

## 2017-08-17 ENCOUNTER — Other Ambulatory Visit (INDEPENDENT_AMBULATORY_CARE_PROVIDER_SITE_OTHER): Payer: Self-pay | Admitting: Physician Assistant

## 2017-08-17 DIAGNOSIS — M4802 Spinal stenosis, cervical region: Secondary | ICD-10-CM

## 2017-08-17 DIAGNOSIS — M4322 Fusion of spine, cervical region: Secondary | ICD-10-CM

## 2017-08-18 ENCOUNTER — Ambulatory Visit: Payer: Self-pay | Admitting: Physical Therapy

## 2017-08-18 ENCOUNTER — Encounter: Payer: Self-pay | Admitting: Physical Therapy

## 2017-08-18 DIAGNOSIS — M62838 Other muscle spasm: Secondary | ICD-10-CM

## 2017-08-18 DIAGNOSIS — M542 Cervicalgia: Secondary | ICD-10-CM

## 2017-08-18 DIAGNOSIS — M5442 Lumbago with sciatica, left side: Secondary | ICD-10-CM

## 2017-08-18 DIAGNOSIS — G8929 Other chronic pain: Secondary | ICD-10-CM

## 2017-08-18 DIAGNOSIS — M6281 Muscle weakness (generalized): Secondary | ICD-10-CM

## 2017-08-18 NOTE — Therapy (Signed)
[place], [place], [address] Phone: [phone]   Fax:  [phone]  Physical Therapy Treatment  Patient Details  Name: Nina Hawkins MRN: [id number] Date of Birth: 1970-10-29 Referring Provider: Clent Demark    Encounter Date: 08/18/2017  PT End of Session - 08/18/17 0725    Visit Number  9    Number of Visits  16    Date for PT Re-Evaluation  09/01/17    Authorization Type  CAFA     PT Start Time  0715    PT Stop Time  0810    PT Time Calculation (min)  55 min       History reviewed. No pertinent past medical history.  Past Surgical History:  Procedure Laterality Date  . ABDOMINAL HYSTERECTOMY      There were no vitals filed for this visit.  Subjective Assessment - 08/18/17 0719    Subjective  I woke up a little painful this morning on the ledt side of the neck. Overall pain has been the same. Some days a little pain and sometimes a lot.     Currently in Pain?  Yes    Pain Score  4     Pain Location  Neck    Pain Orientation  Left    Pain Descriptors / Indicators  Aching    Pain Radiating Towards  left arm, left leg, left side of head    Aggravating Factors   not really, just some days are worse than others     Pain Relieving Factors  rest, meds                       [facility] Adult PT Treatment/Exercise - 08/18/17 0001      Neck Exercises: Standing   Other Standing Exercises  cabinet reach 2# x 10 with LUE, RUE 3/10 pain using LUE    3/10 pain using LUE    Other Standing Exercises  Red band row and shoulder extension bilateral 10 x 2 each + HEP       Neck Exercises: Supine   Neck Retraction  10 reps    Neck Retraction Limitations  with arm circles, horiz abduction, alternating UE/LE     Other Supine Exercise  supine scap stab series red band  pullovers, horizontal abduction, ER , diagonals +HEP       Lumbar Exercises: Aerobic   Stationary Bike  Nustep L 5 UE/LE x 7  minutes       Lumbar Exercises: Supine   Bent Knee Raise  10 reps    Bent Knee Raise Limitations  scissors level 2     Bridge Limitations  x 10     Straight Leg Raise  10 reps      Moist Heat Therapy   Number Minutes Moist Heat  15 Minutes    Moist Heat Location  Cervical      Neck Exercises: Stretches   Corner Stretch  3 reps;20 seconds    Other Neck Stretches  rhomboid stretch at pole                PT Short Term Goals - 08/18/17 0749      PT SHORT TERM GOAL #1   Title  Patient will demsotrate a good core contraction     Status  Achieved      PT SHORT TERM GOAL #2   Title  Patient will increase bilateral cervical rotation by  20 degress bilateral     Baseline  70 bilateral at best , varies    Status  Achieved      PT SHORT TERM GOAL #3   Title  Patient will demsotrate full pain freee left hip flexion and IR     Status  Achieved      PT SHORT TERM GOAL #4   Title  Patient will be independent with inital HEP.     Status  Achieved        PT Long Term Goals - 08/18/17 0726      PT LONG TERM GOAL #1   Title  Patient will demsotrate 65 degrees of bilateral cervical rotation in order to improve safety driving     Baseline  varies, see objective measure , 70 at best     Time  8    Period  Weeks    Status  Partially Met      PT LONG TERM GOAL #2   Title  Patient will put a 2lb weight in overhead cabinet without pain in order to eprfrom work tasks.     Baseline  3/10 pain with 2# cabinet reach left    Time  8    Period  Weeks    Status  On-going      PT LONG TERM GOAL #3   Title  Patient will be independent with HEP to promote further strengthening     Time  8    Period  Weeks    Status  On-going      PT LONG TERM GOAL #4   Title  Patient will demsotrate a 45% limitation on FOTO     Baseline  42%     Time  8    Period  Weeks    Status  Achieved            Plan - 08/18/17 7207    Clinical Impression Statement  Pt had CT of cervical spine.   Recommended she call MD to get results. She reports some days very little neck pain and some days she wakes up with 4-5/10 pain. She reports back pain as minor and 3/10 at most after alot of repetitive job duties. With reaching into cabinet she reports 3/10 pain using LUE and 2# weight. She reports no lifting over shoulder height at work. Limited progress toward remaining goals.     PT Next Visit Plan  Contnue mannual and progress HEP, try Estim?     PT Home Exercise Plan  cervical rotation 2-3x; levator stretch, upper trap stretch; single knee to ches stretch; seated hamstring stretch; lateral trunk rotation; red band shoulder rows and extension, Supine scap stab     Consulted and Agree with Plan of Care  Patient       Patient will benefit from skilled therapeutic intervention in order to improve the following deficits and impairments:  Decreased activity tolerance, Decreased strength, Postural dysfunction, Pain, Increased muscle spasms, Impaired UE functional use, Difficulty walking, Decreased range of motion, Abnormal gait, Increased fascial restricitons  Visit Diagnosis: Cervicalgia  Chronic left-sided low back pain with left-sided sciatica  Other muscle spasm  Muscle weakness (generalized)     Problem List Patient Active Problem List   Diagnosis Date Noted  . H. pylori infection 02/03/2017  . Cervicalgia 02/02/2017  . Abdominal pain, epigastric 02/02/2017  . Chronic left-sided low back pain without sciatica 02/02/2017    Dorene Ar , PTA 08/18/2017, 8:01 AM  [facility]  [facility] [address] [place], [place], [address] Phone: [phone]   Fax:  [phone]  Name: Nina Hawkins MRN: [id number] Date of Birth: [birth year]

## 2017-08-27 ENCOUNTER — Ambulatory Visit: Payer: Self-pay | Admitting: Physical Therapy

## 2017-08-27 ENCOUNTER — Encounter: Payer: Self-pay | Admitting: Physical Therapy

## 2017-08-27 DIAGNOSIS — M62838 Other muscle spasm: Secondary | ICD-10-CM

## 2017-08-27 DIAGNOSIS — M542 Cervicalgia: Secondary | ICD-10-CM

## 2017-08-27 DIAGNOSIS — M5442 Lumbago with sciatica, left side: Secondary | ICD-10-CM

## 2017-08-27 DIAGNOSIS — M6281 Muscle weakness (generalized): Secondary | ICD-10-CM

## 2017-08-27 DIAGNOSIS — G8929 Other chronic pain: Secondary | ICD-10-CM

## 2017-08-27 NOTE — Therapy (Signed)
[place], [place], [address] Phone: [phone]   Fax:  [phone]  Physical Therapy Treatment  Patient Details  Name: Nina Hawkins MRN: [id number] Date of Birth: 08-28-1971 Referring Provider: Clent Demark    Encounter Date: 08/27/2017  PT End of Session - 08/27/17 0727    Visit Number  10    Number of Visits  16    Date for PT Re-Evaluation  09/01/17    Authorization Type  CAFA     PT Start Time  0715    PT Stop Time  0815    PT Time Calculation (min)  60 min       History reviewed. No pertinent past medical history.  Past Surgical History:  Procedure Laterality Date  . ABDOMINAL HYSTERECTOMY      There were no vitals filed for this visit.  Subjective Assessment - 08/27/17 0723    Subjective  MD said there is something closing in my spine and I have an appointment with Orthopedic on Dec 4th. He is going to give me some shots. I feel numbness in my low back that I think is coming from my neck.      Currently in Pain?  Yes    Pain Score  3     Pain Location  Neck    Pain Orientation  Left    Pain Descriptors / Indicators  Aching    Aggravating Factors   varies, day to day, chopping at work     Pain Relieving Factors  rest, meds                        [facility] Adult PT Treatment/Exercise - 08/27/17 0001      Exercises   Exercises  Neck      Neck Exercises: Standing   Other Standing Exercises  cabinet reaching 2# x 10 each arm with no pain today.     Other Standing Exercises  Red band row and shoulder extension bilateral 10 x 2 each + HEP       Neck Exercises: Seated   Neck Retraction  10 reps    Other Seated Exercise  scap squeeze x 10       Neck Exercises: Supine   Neck Retraction  10 reps    Neck Retraction Limitations  with arm circles, horiz abduction, alternating UE/LE     Other Supine Exercise  supine scap stab series red band  pullovers, horizontal  abduction, ER , diagonals +HEP       Lumbar Exercises: Aerobic   UBE (Upper Arm Bike)  L 1 3 min forward, 3 min backward       Moist Heat Therapy   Number Minutes Moist Heat  15 Minutes    Moist Heat Location  Cervical      Manual Therapy   Soft tissue mobilization  sub occipital release, distraction    Passive ROM  Cervical side bend and rotation       Neck Exercises: Stretches   Upper Trapezius Stretch Limitations  3 x 20  sec each    Levator Stretch Limitations  3 x 20 sec each     Corner Stretch  3 reps;20 seconds               PT Short Term Goals - 08/18/17 0749      PT SHORT TERM GOAL #1   Title  Patient will demsotrate a good core  contraction     Status  Achieved      PT SHORT TERM GOAL #2   Title  Patient will increase bilateral cervical rotation by 20 degress bilateral     Baseline  70 bilateral at best , varies    Status  Achieved      PT SHORT TERM GOAL #3   Title  Patient will demsotrate full pain freee left hip flexion and IR     Status  Achieved      PT SHORT TERM GOAL #4   Title  Patient will be independent with inital HEP.     Status  Achieved        PT Long Term Goals - 08/27/17 0817      PT LONG TERM GOAL #1   Title  Patient will demsotrate 65 degrees of bilateral cervical rotation in order to improve safety driving     Baseline  varies, see objective measure , 70 at best     Status  Partially Met      PT LONG TERM GOAL #2   Title  Patient will put a 2lb weight in overhead cabinet without pain in order to eprfrom work tasks.     Time  8    Period  Weeks    Status  Achieved      PT LONG TERM GOAL #3   Title  Patient will be independent with HEP to promote further strengthening     Time  8    Period  Weeks    Status  On-going      PT LONG TERM GOAL #4   Title  Patient will demsotrate a 45% limitation on FOTO     Baseline  42%    Time  8    Period  Weeks    Status  Achieved            Plan - 08/27/17 0811    Clinical  Impression Statement  Pt reports overall about the same. She will see Orthopedic MD on December 4th. Focused on review of HEP and prepped for discharge next visit. Pt was able to place 2# weight in overhead shelf today without pain. LTG# 2 met.     PT Next Visit Plan  discharge next visit.     PT Home Exercise Plan  cervical rotation 2-3x; levator stretch, upper trap stretch; single knee to ches stretch; seated hamstring stretch; lateral trunk rotation; red band shoulder rows and extension, Supine scap stab     Consulted and Agree with Plan of Care  Patient       Patient will benefit from skilled therapeutic intervention in order to improve the following deficits and impairments:  Decreased activity tolerance, Decreased strength, Postural dysfunction, Pain, Increased muscle spasms, Impaired UE functional use, Difficulty walking, Decreased range of motion, Abnormal gait, Increased fascial restricitons  Visit Diagnosis: Cervicalgia  Chronic left-sided low back pain with left-sided sciatica  Other muscle spasm  Muscle weakness (generalized)     Problem List Patient Active Problem List   Diagnosis Date Noted  . H. pylori infection 02/03/2017  . Cervicalgia 02/02/2017  . Abdominal pain, epigastric 02/02/2017  . Chronic left-sided low back pain without sciatica 02/02/2017    Nina Hawkins, PTA 08/27/2017, 8:19 AM  [place], [place], [address] Phone: [phone]   Fax:  [phone]  Name: Nina Hawkins MRN: [id number] Date of Birth: 1971/01/03

## 2017-08-31 ENCOUNTER — Ambulatory Visit: Payer: Self-pay

## 2017-08-31 DIAGNOSIS — G8929 Other chronic pain: Secondary | ICD-10-CM

## 2017-08-31 DIAGNOSIS — M542 Cervicalgia: Secondary | ICD-10-CM

## 2017-08-31 DIAGNOSIS — M62838 Other muscle spasm: Secondary | ICD-10-CM

## 2017-08-31 DIAGNOSIS — M5442 Lumbago with sciatica, left side: Secondary | ICD-10-CM

## 2017-08-31 DIAGNOSIS — M6281 Muscle weakness (generalized): Secondary | ICD-10-CM

## 2017-08-31 NOTE — Therapy (Signed)
[place], [place], [address] Phone: [phone]   Fax:  [phone]  Physical Therapy Treatment/Discharge  Patient Details  Name: Nina Hawkins MRN: [id number] Date of Birth: 07-Apr-1971 Referring Provider: Clent Demark    Encounter Date: 08/31/2017  PT End of Session - 08/31/17 0714    Visit Number  11    Number of Visits  16    Date for PT Re-Evaluation  09/01/17    PT Start Time  0704    PT Stop Time  0745    PT Time Calculation (min)  41 min    Activity Tolerance  Patient tolerated treatment well    Behavior During Therapy  [facility] for tasks assessed/performed       History reviewed. No pertinent past medical history.  Past Surgical History:  Procedure Laterality Date  . ABDOMINAL HYSTERECTOMY      There were no vitals filed for this visit.  Subjective Assessment - 08/31/17 0704    Subjective  PAIN 3/10 today    Currently in Pain?  Yes    Pain Score  3     Pain Location  Neck    Pain Orientation  Left    Pain Descriptors / Indicators  Aching    Pain Type  Chronic pain    Pain Onset  More than a month ago    Pain Frequency  Constant    Aggravating Factors   Work activity    Pain Relieving Factors  meds rest    Pain Score  4    Pain Location  Leg    Pain Orientation  Left    Pain Descriptors / Indicators  Dull    Pain Type  Chronic pain    Pain Onset  More than a month ago    Pain Frequency  Intermittent         [facility] PT Assessment - 08/31/17 0001      AROM   Cervical - Right Side Bend  35    Cervical - Left Side Bend  35    Cervical - Right Rotation  58    Cervical - Left Rotation  58      Strength   Right Hand Grip (lbs)  24,23,23 avg 23 pounds    Left Hand Grip (lbs)  9,10,9 AVG 9 pounds                  [facility] Adult PT Treatment/Exercise - 08/31/17 0001      Moist Heat Therapy   Number Minutes Moist Heat  10 Minutes    Moist Heat Location  Cervical             PT Education - 08/31/17 0714    Education provided  Yes    Education Details  Discharge and MD followup . Need to continue exercises    Person(s) Educated  Patient    Methods  Explanation    Comprehension  Verbalized understanding       PT Short Term Goals - 08/18/17 0749      PT SHORT TERM GOAL #1   Title  Patient will demsotrate a good core contraction     Status  Achieved      PT SHORT TERM GOAL #2   Title  Patient will increase bilateral cervical rotation by 20 degress bilateral     Baseline  70 bilateral at best , varies    Status  Achieved  PT SHORT TERM GOAL #3   Title  Patient will demsotrate full pain freee left hip flexion and IR     Status  Achieved      PT SHORT TERM GOAL #4   Title  Patient will be independent with inital HEP.     Status  Achieved        PT Long Term Goals - 08/31/17 0734      PT LONG TERM GOAL #1   Title  Patient will demsotrate 65 degrees of bilateral cervical rotation in order to improve safety driving     Baseline  58 degrees today    Status  Not Met      PT LONG TERM GOAL #2   Title  Patient will put a 2lb weight in overhead cabinet without pain in order to eprfrom work tasks.     Status  Achieved      PT LONG TERM GOAL #3   Title  Patient will be independent with HEP to promote further strengthening     Status  Achieved      PT LONG TERM GOAL #4   Title  Patient will demsotrate a 45% limitation on FOTO     Baseline  40% today    Status  Achieved            Plan - 08/31/17 0715    Clinical Impression Statement  Over past month improvements have been less and with testing today with grip strength it appeared she did not give good effort and stretgth inpounds was significantly less with less pain than at last testing.  she will follow up with MD about neck stneosis    PT Treatment/Interventions  ADLs/Self Care Home Management;Electrical Stimulation;Cryotherapy;Iontophoresis 72m/ml Dexamethasone;Gait  training;Stair training;Therapeutic activities;Therapeutic exercise;Ultrasound;Moist Heat;Neuromuscular re-education;Cognitive remediation;Patient/family education;Passive range of motion;Manual techniques;Dry needling;Taping    PT Next Visit Plan  No folow up .      PT Home Exercise Plan  cervical rotation 2-3x; levator stretch, upper trap stretch; single knee to ches stretch; seated hamstring stretch; lateral trunk rotation; red band shoulder rows and extension, Supine scap stab     Consulted and Agree with Plan of Care  Patient       Patient will benefit from skilled therapeutic intervention in order to improve the following deficits and impairments:  Decreased activity tolerance, Decreased strength, Postural dysfunction, Pain, Increased muscle spasms, Impaired UE functional use, Difficulty walking, Decreased range of motion, Abnormal gait, Increased fascial restricitons  Visit Diagnosis: Cervicalgia  Chronic left-sided low back pain with left-sided sciatica  Other muscle spasm  Muscle weakness (generalized)     Problem List Patient Active Problem List   Diagnosis Date Noted  . H. pylori infection 02/03/2017  . Cervicalgia 02/02/2017  . Abdominal pain, epigastric 02/02/2017  . Chronic left-sided low back pain without sciatica 02/02/2017    CDarrel Hoover PT 08/31/2017, 7:44 AM  [facility] [address]Phone: [phone]  Fax:  [phone] Name: Nina NoyesMRN: [id number] of Birth: [birth year] PHYSICAL THERAPY DISCHARGE SUMMARY  Visits from Start of Care: 11  Current functional level related to goals / functional outcomes: See above. She has improved bu has plateaued over past few weeks. Will discharge to OrthoMD    Remaining deficits: See above. She had worse measure with grip and ROM but not sure she was giving max effort   Education / Equipment: HEP Plan: Patient  agrees  to discharge.  Patient goals were partially met. Patient is being discharged due to meeting the stated rehab goals.  ?????  Max benefit from PT   

## 2017-09-14 ENCOUNTER — Ambulatory Visit (INDEPENDENT_AMBULATORY_CARE_PROVIDER_SITE_OTHER): Payer: Self-pay | Admitting: Orthopaedic Surgery

## 2017-09-14 ENCOUNTER — Encounter (INDEPENDENT_AMBULATORY_CARE_PROVIDER_SITE_OTHER): Payer: Self-pay | Admitting: Orthopaedic Surgery

## 2017-09-14 VITALS — BP 141/100 | HR 81 | Ht <= 58 in | Wt 180.0 lb

## 2017-09-14 DIAGNOSIS — M542 Cervicalgia: Secondary | ICD-10-CM

## 2017-09-14 NOTE — Progress Notes (Signed)
Office Visit Note   Patient: Nina Hawkins           Date of Birth: 31-Jan-1971           MRN: [id number] Visit Date: 09/14/2017              Requested by: Loletta SpecterGomez, Roger David, PA-C [address] [place], [place] [id number] PCP: Loletta SpecterGomez, Roger David, PA-C   Assessment & Plan: Visit Diagnoses:  1. Cervicalgia     Plan: Patient's blood pressure was elevated today.  She needs to increase her walking cut back on food intake work on some weight loss to help with her blood pressure.  We reviewed the MRI scan images as well as report.  She does not have any nerve compression.  If she has persistent problems with a feeling of burning in her head she can see a neurologist.  She can use some Tylenol and I discussed with her that the ibuprofen may be making her blood pressure be elevated falsely.  She will return if she develops radicular symptoms otherwise follow-up PRN.  Follow-Up Instructions: No Follow-up on file.   Orders:  No orders of the defined types were placed in this encounter.  No orders of the defined types were placed in this encounter.     Procedures: No procedures performed   Clinical Data: No additional findings.   Subjective: Chief Complaint  Patient presents with  . Neck - Pain    HPI 46 year old female with complaints of 2-year history of neck pain.  She states the pain seems to be a little worse on the left than right but today for the last 24 hours she is not been able to get her right arm above her head.  Sometimes both shoulders bother her with pain but today is only the right she has some pain that radiates down to the lumbar spine.  She states she also has a burning sensation that is head.  No photophobia no history of seizures no loss of consciousness no visual disturbance no hearing problems.  Patient is used ibuprofen with little relief.  Blood pressure is elevated today 142/100.  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  No associated bowel or bladder symptoms.  She states both hands get completely numb.  Does not seem to bother her at night.  Review of Systems positive for prediabetes with hemoglobin A1c 6.4.  Positive for neck pain history of abdominal pain back pain.  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   Objective: Vital Signs: BP (!) 141/100   Pulse 81   Ht 4\' 9"  (1.448 m)   Wt 180 lb (81.6 kg)   BMI 38.95 kg/m   Physical Exam  Constitutional: She is oriented to person, place, and time. She appears well-developed.  HENT:  Head: Normocephalic.  Right Ear: External ear normal.  Left Ear: External ear normal.  Eyes: Pupils are equal, round, and reactive to light.  Neck: No tracheal deviation present. No thyromegaly present.  Cardiovascular: Normal rate.  Pulmonary/Chest: Effort normal.  Abdominal: Soft.  Neurological: She is alert and oriented to person, place, and time.  Skin: Skin is warm and dry.  Psychiatric: She has a normal mood and affect. Her behavior is normal.    Ortho Exam patient has minimal brachial plexus tenderness negative Spurling upper extremity reflexes are 2+ and  symmetrical.  Pain with abduction at 90 degrees right shoulder positive impingement.  Long head of the biceps nontender she has tenderness over the trapezial muscles lumbosacral junction negative straight leg raising 90 degrees.  She is able to heel and toe walk.  No lower extremity clonus.  Good forward flexion chin to chest normal extension.  Specialty Comments:  No specialty comments available.  Imaging: CLINICAL DATA:  Neck pain that radiates into the head for 1 year. Lifting injury 2 years ago.  EXAM: CT CERVICAL SPINE WITHOUT CONTRAST  TECHNIQUE: Multidetector CT imaging of the cervical spine was performed without intravenous contrast. Multiplanar CT image reconstructions were also generated.  COMPARISON:  Plain films of  05/24/2017  FINDINGS: Alignment: Spinal visualization through the bottom of T2. Developmental fusion of the C2 and C3 vertebral bodies. Borderline wedging of the C4 vertebral body as was detailed on prior radiographs. Favored to be within normal variation. Other vertebral body height is maintained. Straightening of expected lordosis.  Skull base and vertebrae: Normal skull base. Facets are well-aligned. Normal C1-2 articulation on coronal reformats.  Soft tissues and spinal canal: Prevertebral soft tissues are within normal limits.  Disc levels: Right neural foraminal narrowing at C3-4 secondary to uncovertebral joint hypertrophy. mild disc osteophyte complex at C3-4 which causes mild narrowing of the ventral canal.  Minimal left neural foraminal narrowing at C4-5 secondary to uncovertebral joint hypertrophy. Disc osteophyte complex at C4-5 causes mild narrowing of the right-side of the canal.  Upper chest: Clear lung apices.  Other: None.  IMPRESSION: 1. Mild cervical spondylosis, without acute fracture or subluxation. 2. Nonspecific straightening of expected lordosis. 3. 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   Electronically Signed   By: Jeronimo GreavesKyle  Talbot M.D.   On: 08/17/2017 10:10   PMFS History: Patient Active Problem List   Diagnosis Date Noted  . H. pylori infection 02/03/2017  . Cervicalgia 02/02/2017  . Abdominal pain, epigastric 02/02/2017  . Chronic left-sided low back pain without sciatica 02/02/2017   No past medical history on file.  No family history on file.  Past Surgical History:  Procedure Laterality Date  . ABDOMINAL HYSTERECTOMY     Social History   Occupational History  . Not on file  Tobacco Use  . Smoking status: Never Smoker  . Smokeless tobacco: Never Used  Substance and Sexual Activity  . Alcohol use: Yes  . Drug use: Not on file  . Sexual activity: Not on file

## 2017-11-09 ENCOUNTER — Ambulatory Visit (INDEPENDENT_AMBULATORY_CARE_PROVIDER_SITE_OTHER): Payer: Self-pay | Admitting: Physician Assistant

## 2017-11-09 ENCOUNTER — Encounter (INDEPENDENT_AMBULATORY_CARE_PROVIDER_SITE_OTHER): Payer: Self-pay | Admitting: Physician Assistant

## 2017-11-09 VITALS — BP 126/87 | HR 66 | Temp 98.2°F | Resp 18 | Ht <= 58 in | Wt 169.0 lb

## 2017-11-09 DIAGNOSIS — R7303 Prediabetes: Secondary | ICD-10-CM

## 2017-11-09 DIAGNOSIS — K5900 Constipation, unspecified: Secondary | ICD-10-CM

## 2017-11-09 DIAGNOSIS — Z23 Encounter for immunization: Secondary | ICD-10-CM

## 2017-11-09 DIAGNOSIS — R1084 Generalized abdominal pain: Secondary | ICD-10-CM

## 2017-11-09 DIAGNOSIS — R140 Abdominal distension (gaseous): Secondary | ICD-10-CM

## 2017-11-09 MED ORDER — RESTORA PO CAPS: 1.0000 | ORAL_CAPSULE | Freq: Every day | ORAL | 1 refills | Status: DC

## 2017-11-09 MED ORDER — RANITIDINE HCL 150 MG PO TABS: 150.0000 mg | ORAL_TABLET | Freq: Two times a day (BID) | ORAL | 0 refills | Status: DC

## 2017-11-09 MED ORDER — SENNOSIDES-DOCUSATE SODIUM 8.6-50 MG PO TABS: 1.0000 | ORAL_TABLET | Freq: Every day | ORAL | 0 refills | Status: DC | PRN

## 2017-11-09 NOTE — Progress Notes (Signed)
Subjective:  Patient ID: Nina Hawkins, female    DOB: 1971-06-15  Age: 47 y.o. MRN: [id number]  CC: DM   HPI  Darien Ramusna Martinezis a 47 y.o.femalewith a PMH of prediabetes, chronic left sided LBP and left sided cervicalgia presents on f/u for prediabetes. Last A1c 6.4% five months ago. Patient has been going to the gym 3 times per week and usually bikes or uses the treadmill. No anti-glycemics. Complains of abdominal bloating, gas, generalized abdominal pain, and constipation. Drinks minimal amount of water. Blames her job for not allowing enough breaks. Does not endorse any other symptoms or complains.   ROS Review of Systems  Constitutional: Negative for chills, fever and malaise/fatigue.  Eyes: Negative for blurred vision.  Respiratory: Negative for shortness of breath.   Cardiovascular: Negative for chest pain and palpitations.  Gastrointestinal: Positive for abdominal pain and constipation. Negative for melena and nausea.       Abdominal bloating  Genitourinary: Negative for dysuria and hematuria.  Musculoskeletal: Negative for joint pain and myalgias.  Skin: Negative for rash.  Neurological: Negative for tingling and headaches.  Psychiatric/Behavioral: Negative for depression. The patient is not nervous/anxious.     Objective:  BP 126/87 (BP Location: Left Arm, Patient Position: Sitting, Cuff Size: Large)   Pulse 66   Temp 98.2 F (36.8 C) (Oral)   Resp 18   Ht 4\' 9"  (1.448 m)   Wt 169 lb (76.7 kg)   BMI 36.57 kg/m   BP/Weight 11/09/2017 09/14/2017 08/10/2017  Systolic BP 126 141 130  Diastolic BP 87 100 84  Wt. (Lbs) 169 180 180  BMI 36.57 38.95 37.78      Physical Exam  Constitutional: She is oriented to person, place, and time.  Well developed, obese, NAD, polite  HENT:  Head: Normocephalic and atraumatic.  Eyes: No scleral icterus.  Neck: Normal range of motion. Neck supple. No thyromegaly present.  Cardiovascular: Normal rate, regular rhythm and normal  heart sounds. Exam reveals no gallop.  No murmur heard. Pulmonary/Chest: Effort normal and breath sounds normal. No respiratory distress. She has no wheezes.  Abdominal: Soft. Bowel sounds are normal. There is tenderness (mild TTP in all quadrants. Mild hyperresonance on percussion. No hepatomegaly.).  Musculoskeletal: She exhibits no edema.  Neurological: She is alert and oriented to person, place, and time. No cranial nerve deficit. Coordination normal.  Skin: Skin is warm and dry. No rash noted. No erythema. No pallor.  Psychiatric: She has a normal mood and affect. Her behavior is normal. Thought content normal.  Vitals reviewed.    Assessment & Plan:    1. Prediabetes - Hemoglobin A1c sent out. - Comprehensive metabolic panel  2. Abdominal bloating - Begin Probiotic Product (RESTORA) CAPS; Take 1 capsule by mouth daily.  Dispense: 30 capsule; Refill: 1 - Begin ranitidine (ZANTAC) 150 MG tablet; Take 1 tablet (150 mg total) by mouth 2 (two) times daily.  Dispense: 30 tablet; Refill: 0  3. Generalized abdominal pain - Hemoglobin A1c sent out - Comprehensive metabolic panel  4. Constipation, unspecified constipation type - Hemoglobin A1c sent out - Comprehensive metabolic panel - Probiotic Product (RESTORA) CAPS; Take 1 capsule by mouth daily.  Dispense: 30 capsule; Refill: 1 - Begin senna-docusate (SENOKOT-S) 8.6-50 MG tablet; Take 1 tablet by mouth daily as needed for mild constipation.  Dispense: 30 tablet; Refill: 0  5. Need for influenza vaccination - Flu Vaccine QUAD 6+ mos PF IM (Fluarix Quad PF)   Meds ordered this encounter  Medications  .  Probiotic Product (RESTORA) CAPS    Sig: Take 1 capsule by mouth daily.    Dispense:  30 capsule    Refill:  1    Order Specific Question:   Supervising Provider    Answer:   Quentin Angst [id number]  . senna-docusate (SENOKOT-S) 8.6-50 MG tablet    Sig: Take 1 tablet by mouth daily as needed for mild constipation.     Dispense:  30 tablet    Refill:  0    Order Specific Question:   Supervising Provider    Answer:   Quentin Angst [id number]  . ranitidine (ZANTAC) 150 MG tablet    Sig: Take 1 tablet (150 mg total) by mouth 2 (two) times daily.    Dispense:  30 tablet    Refill:  0    Order Specific Question:   Supervising Provider    Answer:   Quentin Angst [id number]    Follow-up: Return in about 6 months (around 05/09/2018).   Loletta Specter PA

## 2017-11-09 NOTE — Patient Instructions (Signed)
Diabetes mellitus y nutrición  Diabetes Mellitus and Nutrition  Si sufre de diabetes (diabetes mellitus), es muy importante tener hábitos alimenticios saludables debido a que sus niveles de azúcar en la sangre (glucosa) se ven afectados en gran medida por lo que come y bebe. Comer alimentos saludables en las cantidades adecuadas, aproximadamente a la misma hora todos los días, lo ayudará a:  · Controlar la glucemia.  · Disminuir el riesgo de sufrir una enfermedad cardíaca.  · Mejorar la presión arterial.  · Alcanzar o mantener un peso saludable.    Todas las personas que sufren de diabetes son diferentes y cada una tiene necesidades diferentes en cuanto a un plan de alimentación. El médico puede recomendarle que trabaje con un especialista en dietas y nutrición (nutricionista) para elaborar el mejor plan para usted. Su plan de alimentación puede variar según factores como:  · Las calorías que necesita.  · Los medicamentos que toma.  · Su peso.  · Sus niveles de glucemia, presión arterial y colesterol.  · Su nivel de actividad.  · Otras afecciones que tenga, como enfermedades cardíacas o renales.    ¿Cómo me afectan los carbohidratos?  Los carbohidratos afectan el nivel de glucemia más que cualquier otro tipo de alimento. La ingesta de carbohidratos naturalmente aumenta la cantidad glucosa en la sangre. El recuento de carbohidratos es un método destinado a llevar un registro de la cantidad de carbohidratos que se ingieren. El recuento de carbohidratos es importante para mantener la glucemia a un nivel saludable, en especial si utiliza insulina o toma determinados medicamentos por vía oral para la diabetes.  Es importante saber la cantidad de carbohidratos que se pueden ingerir en cada comida sin correr ningún riesgo. Esto es diferente en cada persona. El nutricionista puede ayudarlo a calcular la cantidad de carbohidratos que debe ingerir en cada comida y colación.   Los alimentos que contienen carbohidratos incluyen:  · Pan, cereal, arroz, pasta y galletas.  · Papas y maíz.  · Guisantes, frijoles y lentejas.  · Leche y yogur.  · Frutas y jugo.  · Postres, como pasteles, galletitas, helado y caramelos.    ¿Cómo me afecta el alcohol?  El alcohol puede provocar disminuciones súbitas de la glucemia (hipoglucemia), en especial si utiliza insulina o toma determinados medicamentos por vía oral para la diabetes. La hipoglucemia es una afección potencialmente mortal. Los síntomas de la hipoglucemia (somnolencia, mareos y confusión) son similares a los síntomas de haber consumido demasiado alcohol.  Si el médico afirma que el alcohol es seguro para usted, siga estas pautas:  · Limite el consumo de alcohol a no más de 1 medida por día si es mujer y no está embarazada, y a 2 medidas si es hombre. Una medida equivale a 12 oz (355 ml) de cerveza, 5 oz (148 ml) de vino o 1½ oz (44 ml) de bebidas de alta graduación alcohólica.  · No beba con el estómago vacío.  · Manténgase hidratado con agua, gaseosas dietéticas o té helado sin azúcar.  · Tenga en cuenta que las gaseosas comunes, los jugos y otros refrescos pueden contener mucha azúcar y se deben contar como carbohidratos.    Consejos para seguir este plan  Leer las etiquetas de los alimentos  · Comience por controlar el tamaño de la porción en la etiqueta. La cantidad de calorías, carbohidratos, grasas y otros nutrientes mencionados en la etiqueta se basan en una porción del alimento. Muchos alimentos contienen más de una porción por envase.  · Verifique la cantidad total de gramos (g)   de carbohidratos totales en una porción. Puede calcular la cantidad de porciones de carbohidratos al dividir el total de carbohidratos por 15. Por ejemplo, si un alimento posee un total de 30 g de carbohidratos, equivale a 2 porciones de carbohidratos.  · Verifique la cantidad de gramos (g) de grasas saturadas y grasas trans  en una porción. Escoja alimentos que no contengan grasa o que tengan un bajo contenido.  · Controle la cantidad de miligramos (mg) de sodio en una porción. La mayoría de las personas deben limitar la ingesta de sodio total a menos de 2300 mg por día.  · Siempre consulte la información nutricional de los alimentos etiquetados como “con bajo contenido de grasa” o “sin grasa”. Estos alimentos pueden ser más altos en azúcar agregada o en carbohidratos refinados y deben evitarse.  · Hable con el nutricionista para identificar sus objetivos diarios en cuanto a los nutrientes mencionados en la etiqueta.  De compras  · Evite comprar alimentos procesados, enlatados o prehechos. Estos alimentos tienden a tener mayor cantidad de grasa, sodio y azúcar agregada.  · Compre en la zona exterior de la tienda de comestibles. Esta incluye frutas y vegetales frescos, granos a granel, carnes frescas y productos lácteos frescos.  Cocción  · Utilice métodos de cocción a baja temperatura, como hornear, en lugar de métodos de cocción a alta temperatura, como freír en abundante aceite.  · Cocine con aceites saludables, como el aceite de oliva, canola o girasol.  · Evite cocinar con manteca, crema o carnes con alto contenido de grasa.  Planificación de las comidas  · Consuma las comidas y las colaciones de forma regular, preferentemente a la misma hora todos los días. Evite pasar largos períodos de tiempo sin comer.  · Consuma alimentos ricos en fibra, como frutas frescas, verduras, frijoles y cereales integrales. Consulte al nutricionista sobre cuántas porciones de carbohidratos puede consumir en cada comida.  · Consuma entre 4 y 6 onzas de proteínas magras por día, como carnes magras, pollo, pescado, huevos o tofu. 1 onza equivale a 1 onza de carne, pollo o pescado, 1 huevo, o 1/4 taza de tofu.  · Coma algunos alimentos por día que contengan grasas saludables, como aguacates, frutos secos, semillas y pescado.  Estilo de vida     · Controle su nivel de glucemia con regularidad.  · Haga ejercicio al menos 30 minutos, 5 días o más por semana, o como se lo haya indicado el médico.  · Tome los medicamentos como se lo haya indicado el médico.  · No consuma ningún producto que contenga nicotina o tabaco, como cigarrillos y cigarrillos electrónicos. Si necesita ayuda para dejar de fumar, consulte al médico.  · Trabaje con un asesor o instructor en diabetes para identificar estrategias para controlar el estrés y cualquier desafío emocional y social.  ¿Cuáles son algunas de las preguntas que puedo hacerle a mi médico?  · ¿Es necesario que me reúna con un instructor en diabetes?  · ¿Es necesario que me reúna con un nutricionista?  · ¿A qué número puedo llamar si tengo preguntas?  · ¿Cuáles son los mejores momentos para controlar la glucemia?  Dónde encontrar más información:  · Asociación Americana de la Diabetes (American Diabetes Association): diabetes.org/food-and-fitness/food  · Academia de Nutrición y Dietética (Academy of Nutrition and Dietetics): www.eatright.org/resources/health/diseases-and-conditions/diabetes  · Instituto Nacional de la Diabetes y las Enfermedades Digestivas y Renales (National Institute of Diabetes and Digestive and Kidney Diseases) (Institutos Nacionales de Salud, NIH): www.niddk.nih.gov/health-information/diabetes/overview/diet-eating-physical-activity  Resumen  · Un plan de alimentación saludable   lo ayudará a controlar la glucemia y mantener un estilo de vida saludable.  · Trabajar con un especialista en dietas y nutrición (nutricionista) puede ayudarlo a elaborar el mejor plan de alimentación para usted.  · Tenga en cuenta que los carbohidratos y el alcohol tienen efectos inmediatos en sus niveles de glucemia. Es importante contar los carbohidratos y consumir alcohol con prudencia.  Esta información no tiene como fin reemplazar el consejo del médico. Asegúrese de hacerle al médico cualquier pregunta que tenga.   Document Released: 01/05/2008 Document Revised: 01/18/2017 Document Reviewed: 01/18/2017  Elsevier Interactive Patient Education © 2018 [employer]

## 2017-11-10 LAB — COMPREHENSIVE METABOLIC PANEL
ALBUMIN: 4.6 g/dL (ref 3.5–5.5)
ALT: 16 IU/L (ref 0–32)
AST: 16 IU/L (ref 0–40)
Albumin/Globulin Ratio: 1.8 (ref 1.2–2.2)
Alkaline Phosphatase: 74 IU/L (ref 39–117)
BUN / CREAT RATIO: 16 (ref 9–23)
BUN: 11 mg/dL (ref 6–24)
Bilirubin Total: 0.2 mg/dL (ref 0.0–1.2)
CALCIUM: 9.8 mg/dL (ref 8.7–10.2)
CO2: 23 mmol/L (ref 20–29)
Chloride: 103 mmol/L (ref 96–106)
Creatinine, Ser: 0.68 mg/dL (ref 0.57–1.00)
GFR calc Af Amer: 121 mL/min/{1.73_m2} (ref >59)
GFR, EST NON AFRICAN AMERICAN: 105 mL/min/{1.73_m2} (ref >59)
GLOBULIN, TOTAL: 2.6 g/dL (ref 1.5–4.5)
GLUCOSE: 91 mg/dL (ref 65–99)
Potassium: 4.9 mmol/L (ref 3.5–5.2)
SODIUM: 142 mmol/L (ref 134–144)
TOTAL PROTEIN: 7.2 g/dL (ref 6.0–8.5)

## 2017-11-10 LAB — HEMOGLOBIN A1C
Est. average glucose Bld gHb Est-mCnc: 126 mg/dL
HEMOGLOBIN A1C: 6 % — AB (ref 4.8–5.6)

## 2017-11-11 ENCOUNTER — Telehealth (INDEPENDENT_AMBULATORY_CARE_PROVIDER_SITE_OTHER): Payer: Self-pay | Admitting: Physician Assistant

## 2017-11-11 NOTE — Telephone Encounter (Signed)
-----   Message from Loletta Specteroger David Gomez, PA-C sent at 11/10/2017  8:55 AM EST ----- Sugar is better overall. Keep up the good work and continue to exercise.

## 2017-11-11 NOTE — Telephone Encounter (Signed)
Patient verified DOB Front desk Nina BosworthCarlos interpreted for patient. Patient is aware of sugar being much better overall and to keep up the good work and exercise. No further questions.

## 2017-11-17 ENCOUNTER — Ambulatory Visit: Payer: Self-pay | Attending: Physician Assistant

## 2017-11-17 MED FILL — raNITIdine HCL 150 MG TABS: 150 | 15 days supply | Qty: 30 | Fill #0

## 2017-12-24 ENCOUNTER — Emergency Department (HOSPITAL_COMMUNITY)
Admission: EM | Admit: 2017-12-24 | Discharge: 2017-12-24 | Disposition: A | Discharge status: Home / Self Care | Payer: No Typology Code available for payment source | Attending: Emergency Medicine | Admitting: Emergency Medicine

## 2017-12-24 ENCOUNTER — Emergency Department (HOSPITAL_COMMUNITY): Payer: No Typology Code available for payment source

## 2017-12-24 ENCOUNTER — Encounter (HOSPITAL_COMMUNITY): Payer: Self-pay | Admitting: Emergency Medicine

## 2017-12-24 DIAGNOSIS — Y92410 Unspecified street and highway as the place of occurrence of the external cause: Secondary | ICD-10-CM | POA: Diagnosis not present

## 2017-12-24 DIAGNOSIS — M549 Dorsalgia, unspecified: Secondary | ICD-10-CM | POA: Insufficient documentation

## 2017-12-24 DIAGNOSIS — S0240CA Maxillary fracture, right side, initial encounter for closed fracture: Secondary | ICD-10-CM | POA: Insufficient documentation

## 2017-12-24 DIAGNOSIS — R109 Unspecified abdominal pain: Secondary | ICD-10-CM | POA: Insufficient documentation

## 2017-12-24 DIAGNOSIS — Y999 Unspecified external cause status: Secondary | ICD-10-CM | POA: Insufficient documentation

## 2017-12-24 DIAGNOSIS — R0789 Other chest pain: Secondary | ICD-10-CM | POA: Diagnosis not present

## 2017-12-24 DIAGNOSIS — Y939 Activity, unspecified: Secondary | ICD-10-CM | POA: Insufficient documentation

## 2017-12-24 DIAGNOSIS — S0181XA Laceration without foreign body of other part of head, initial encounter: Secondary | ICD-10-CM | POA: Insufficient documentation

## 2017-12-24 LAB — I-STAT CHEM 8, ED
BUN: 10 mg/dL (ref 6–20)
CALCIUM ION: 1.17 mmol/L (ref 1.15–1.40)
Chloride: 103 mmol/L (ref 101–111)
Creatinine, Ser: 0.6 mg/dL (ref 0.44–1.00)
Glucose, Bld: 164 mg/dL — ABNORMAL HIGH (ref 65–99)
HCT: 47 % — ABNORMAL HIGH (ref 36.0–46.0)
HEMOGLOBIN: 16 g/dL — AB (ref 12.0–15.0)
Potassium: 3.2 mmol/L — ABNORMAL LOW (ref 3.5–5.1)
SODIUM: 140 mmol/L (ref 135–145)
TCO2: 25 mmol/L (ref 22–32)

## 2017-12-24 LAB — COMPREHENSIVE METABOLIC PANEL
ALBUMIN: 3.9 g/dL (ref 3.5–5.0)
ALT: 18 U/L (ref 14–54)
ANION GAP: 11 (ref 5–15)
AST: 22 U/L (ref 15–41)
Alkaline Phosphatase: 81 U/L (ref 38–126)
BILIRUBIN TOTAL: 0.7 mg/dL (ref 0.3–1.2)
BUN: 9 mg/dL (ref 6–20)
CO2: 24 mmol/L (ref 22–32)
Calcium: 9.2 mg/dL (ref 8.9–10.3)
Chloride: 103 mmol/L (ref 101–111)
Creatinine, Ser: 0.68 mg/dL (ref 0.44–1.00)
GFR calc Af Amer: >60 mL/min (ref >60)
GFR calc non Af Amer: >60 mL/min (ref >60)
GLUCOSE: 165 mg/dL — AB (ref 65–99)
POTASSIUM: 3.2 mmol/L — AB (ref 3.5–5.1)
SODIUM: 138 mmol/L (ref 135–145)
TOTAL PROTEIN: 6.8 g/dL (ref 6.5–8.1)

## 2017-12-24 LAB — CBC
HCT: 45.2 % (ref 36.0–46.0)
Hemoglobin: 14.9 g/dL (ref 12.0–15.0)
MCH: 28 pg (ref 26.0–34.0)
MCHC: 33 g/dL (ref 30.0–36.0)
MCV: 84.8 fL (ref 78.0–100.0)
Platelets: 267 10*3/uL (ref 150–400)
RBC: 5.33 MIL/uL — ABNORMAL HIGH (ref 3.87–5.11)
RDW: 13.6 % (ref 11.5–15.5)
WBC: 7.4 10*3/uL (ref 4.0–10.5)

## 2017-12-24 LAB — SAMPLE TO BLOOD BANK

## 2017-12-24 LAB — PROTIME-INR
INR: 0.95
Prothrombin Time: 12.6 seconds (ref 11.4–15.2)

## 2017-12-24 LAB — I-STAT CG4 LACTIC ACID, ED: Lactic Acid, Venous: 2.55 mmol/L (ref 0.5–1.9)

## 2017-12-24 MED ORDER — KETOROLAC TROMETHAMINE 30 MG/ML IJ SOLN
15.0000 mg | Freq: Once | INTRAMUSCULAR | Status: AC
  Administered 2017-12-24: 15 mg via INTRAVENOUS
  Filled 2017-12-24: qty 1

## 2017-12-24 MED ORDER — FENTANYL CITRATE (PF) 100 MCG/2ML IJ SOLN
50.0000 ug | INTRAMUSCULAR | Status: DC | PRN
  Administered 2017-12-24 (×2): 50 ug via INTRAVENOUS
  Filled 2017-12-24 (×2): qty 2

## 2017-12-24 MED ORDER — SALINE SPRAY 0.65 % NA SOLN
1.0000 | Freq: Once | NASAL | Status: DC
  Filled 2017-12-24: qty 44

## 2017-12-24 MED ORDER — LIDOCAINE-EPINEPHRINE 1 %-1:100000 IJ SOLN
30.0000 mL | Freq: Once | INTRAMUSCULAR | Status: AC
  Administered 2017-12-24: 10 mL
  Filled 2017-12-24: qty 30

## 2017-12-24 MED ORDER — IBUPROFEN 400 MG PO TABS: 400.0000 mg | ORAL_TABLET | Freq: Three times a day (TID) | ORAL | 0 refills | Status: AC

## 2017-12-24 MED ORDER — TRAMADOL HCL 50 MG PO TABS: 50.0000 mg | ORAL_TABLET | Freq: Four times a day (QID) | ORAL | 0 refills | Status: DC | PRN

## 2017-12-24 MED ORDER — HYDROCODONE-ACETAMINOPHEN 5-325 MG PO TABS
1.0000 | ORAL_TABLET | Freq: Once | ORAL | Status: AC
  Administered 2017-12-24: 1 via ORAL
  Filled 2017-12-24: qty 1

## 2017-12-24 MED ORDER — IOPAMIDOL (ISOVUE-300) INJECTION 61%
INTRAVENOUS | Status: AC
  Administered 2017-12-24: 100 mL
  Filled 2017-12-24: qty 100
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
## 2017-12-24 NOTE — ED Notes (Signed)
Pt requesting to get up to go to bathroom. Pt offered bedpan and female urinal. Pt would like to wait until she can walk to bathroom. RN waiting to ask Jeraldine LootsLockwood MD when available.

## 2017-12-24 NOTE — Consult Note (Signed)
Preoperative Dx: Upper left lip laceration  Postoperative Dx: Same  Procedure: Complex repair of upper lip laceration muscle, mucosa and skin 5 cm  Surgeon: Dr. Alan Ripperlaire Gumecindo Hopkin  Anesthesia: Lidocaine 1% with 1:100,000 epinepherine  Indication for Procedure: laceration  Description of Procedure: Risks and complications were explained to the patient.  Consent was confirmed.  Time out was called and all information was confirmed to be correct.  The area was prepped with betadine and drapped.  Lidocaine 1% with epinepherine was injected in the subcutaneous area.  After waiting several minutes for the lidocaine to take affect.  The area was cleaned well with betadine.  There was a full thickness injury with the skin of the upper lip, red portion of the lip, muscle and mucosa.  There were no foreign bodies noted.  The deep muscle layer was re-approximated using a 4-0 Vicryl at the tip.  The red portion of the lip was pulled together with vertical mattress and simple interuped sutures.  The lip portion was then repaired with 5-0 Monocryl.  The mucosa was repaired with the 5-0 Vicryl vertical mattress sutures for a 5 cm total length of repair. The patient is to follow up in one week.  She tolerated the procedure well and there were no complications.

## 2017-12-24 NOTE — ED Notes (Signed)
ED Provider at bedside. 

## 2017-12-24 NOTE — ED Notes (Signed)
Removed c-collar per Jeraldine LootsLockwood MD. Pt given water also
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
## 2018-01-04 DIAGNOSIS — S01511A Laceration without foreign body of lip, initial encounter: Secondary | ICD-10-CM | POA: Insufficient documentation

## 2018-01-06 ENCOUNTER — Other Ambulatory Visit: Payer: Self-pay

## 2018-01-06 ENCOUNTER — Ambulatory Visit (INDEPENDENT_AMBULATORY_CARE_PROVIDER_SITE_OTHER): Payer: Self-pay | Admitting: Physician Assistant

## 2018-01-06 ENCOUNTER — Encounter (INDEPENDENT_AMBULATORY_CARE_PROVIDER_SITE_OTHER): Payer: Self-pay | Admitting: Physician Assistant

## 2018-01-06 VITALS — BP 145/95 | HR 68 | Temp 98.0°F | Ht <= 58 in | Wt 165.4 lb

## 2018-01-06 DIAGNOSIS — R109 Unspecified abdominal pain: Secondary | ICD-10-CM

## 2018-01-06 DIAGNOSIS — S0181XA Laceration without foreign body of other part of head, initial encounter: Secondary | ICD-10-CM

## 2018-01-06 MED ORDER — ACETAMINOPHEN-CODEINE #3 300-30 MG PO TABS: 1.0000 | ORAL_TABLET | Freq: Three times a day (TID) | ORAL | 0 refills | Status: AC | PRN

## 2018-01-06 MED ORDER — NAPROXEN 500 MG PO TABS: 500.0000 mg | ORAL_TABLET | Freq: Two times a day (BID) | ORAL | 1 refills | Status: DC

## 2018-01-06 MED FILL — NAPROXEN 500 MG TABLET: 500 | 7 days supply | Qty: 14 | Fill #0

## 2018-01-06 NOTE — Patient Instructions (Signed)
Contusin  Contusion  Una contusin es un hematoma profundo. Las contusiones son el resultado de un traumatismo cerrado en los tejidos y las fibras musculares que estn debajo de la piel. La lesin causa una hemorragia debajo de la piel. La piel sobre la contusin puede tornarse de color azul, morado o amarillo. Las lesiones menores causarn contusiones indoloras; sin embargo, en las contusiones ms graves, el dolor y la hinchazn pueden prolongarse durante algunas semanas.  Cules son las causas?  Generalmente, esta afeccin se debe a un golpe, un traumatismo o una fuerza directa en una zona del cuerpo.  Cules son los signos o los sntomas?  Los sntomas de esta afeccin incluyen lo siguiente:   Hinchazn de la zona lesionada.   Dolor y sensibilidad en la zona de la lesin.   Cambio de color. La zona puede enrojecerse y luego ponerse azul, morada o amarilla.    Cmo se diagnostica?  Esta afeccin se diagnostica en funcin de un examen fsico y de la historia clnica. Puede ser necesario hacer una radiografa, una tomografa computarizada (TC) o una resonancia magntica (RM) para determinar si hubo lesiones asociadas, como huesos rotos (fracturas).  Cmo se trata?  El tratamiento especfico de esta afeccin depender de la zona del cuerpo donde se produjo la lesin. En general, el mejor tratamiento para una contusin es el reposo, la aplicacin de hielo, la compresin y la elevacin de la zona de la lesin. Generalmente, esto se conoce como la estrategia de RHCE. Para controlar el dolor, tambin pueden recomendarle antiinflamatorios de venta libre.  Siga estas instrucciones en su casa:   Mantenga la zona de la lesin en reposo.   Si se lo indican, aplique hielo sobre la zona lesionada:  ? Ponga el hielo en una bolsa plstica.  ? Coloque una toalla entre la piel y la bolsa de hielo.  ? Coloque el hielo durante 20minutos, 2 a 3veces por da.   Si se lo indican, ejerza una compresin suave en la zona de la  lesin con una venda elstica. Asegrese de que la venda no est muy ajustada. Qutese y vuelva a colocarse la venda como se lo haya indicado el mdico.   Si es posible, cuando est sentado o acostado, levante (eleve) la zona de la lesin por encima del nivel del corazn.   Tome los medicamentos de venta libre y los recetados solamente como se lo haya indicado el mdico.  Comunquese con un mdico si:   Los sntomas no mejoran despus de varios das de tratamiento.   Los sntomas empeoran.   Tiene dificultad para mover la zona lesionada.  Solicite ayuda de inmediato si:   Siente dolor intenso.   Siente adormecimiento en una mano o un pie.   La mano o el pie estn plidos o fros.  Esta informacin no tiene como fin reemplazar el consejo del mdico. Asegrese de hacerle al mdico cualquier pregunta que tenga.  Document Released: 07/08/2005 Document Revised: 12/30/2016 Document Reviewed: 02/13/2015  Elsevier Interactive Patient Education  2018 Elsevier Inc.
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
## 2018-01-07 MED FILL — ACETAMINOPHEN/COD #3 TABLET: 300-30 | 7 days supply | Qty: 21 | Fill #0

## 2018-01-11 ENCOUNTER — Ambulatory Visit: Payer: Self-pay

## 2018-01-20 ENCOUNTER — Ambulatory Visit (INDEPENDENT_AMBULATORY_CARE_PROVIDER_SITE_OTHER): Payer: Self-pay | Admitting: Physician Assistant

## 2018-01-26 ENCOUNTER — Encounter (INDEPENDENT_AMBULATORY_CARE_PROVIDER_SITE_OTHER): Payer: Self-pay | Admitting: Nurse Practitioner

## 2018-01-26 ENCOUNTER — Other Ambulatory Visit: Payer: Self-pay

## 2018-01-26 ENCOUNTER — Ambulatory Visit (INDEPENDENT_AMBULATORY_CARE_PROVIDER_SITE_OTHER): Payer: Self-pay | Admitting: Nurse Practitioner

## 2018-01-26 VITALS — BP 140/91 | HR 65 | Temp 97.5°F | Ht <= 58 in | Wt 172.6 lb

## 2018-01-26 DIAGNOSIS — R109 Unspecified abdominal pain: Secondary | ICD-10-CM

## 2018-01-26 MED ORDER — CYCLOBENZAPRINE HCL 5 MG PO TABS: 5.0000 mg | ORAL_TABLET | Freq: Three times a day (TID) | ORAL | 1 refills | Status: DC | PRN

## 2018-01-26 MED ORDER — IBUPROFEN 600 MG PO TABS: 600.0000 mg | ORAL_TABLET | Freq: Three times a day (TID) | ORAL | 1 refills | Status: DC | PRN
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
## 2018-01-26 NOTE — Patient Instructions (Signed)
Distensin muscular. (Muscle Strain) Una distensin muscular (estiramiento muscular) ocurre cuando un msculo se estira ms all de la longitud normal. Ocurre cuando una fuerza violenta bruscamente estira demasiado el msculo. Generalmente se desgarran algunas de las fibras del msculo. La distensin muscular es comn en los atletas. La recuperacin normalmente tarda de 1 a 2semanas. La curacin completa tarda de 5 a 6semanas. CUIDADOS EN EL HOGAR  Siga el mtodo PRICE (por sus siglas en ingls) de tratamiento para que la lesin mejore. Hgalo durante los 2 a 3 primeros das despus de la lesin: ? Proteccin. Proteja el msculo para evitar que se vuelva a lesionar. ? Reposo. Limite la actividad y descanse la parte del cuerpo lesionada. ? Hielo. Ponga el hielo en una bolsa plstica. Coloque una toalla entre la piel y la bolsa de hielo. Luego aplique el hielo y djelo actuar de 15 a 20minutos por hora. Despus del tercer da, cambie a compresas de calor hmedo. ? Compresin. Use una frula o venda elstica en la zona lesionada para brindar alivio. No la ajuste demasiado. ? Elevacin. Eleve la zona lesionada por encima del nivel del corazn.  Solo tome los medicamentos que le haya indicado su mdico.  Realice un calentamiento antes de hacer ejercicio para prevenir distensiones musculares futuras.  SOLICITE AYUDA SI:  Siente ms dolor o inflamacin (hinchazn) en la zona lesionada.  Siente adormecimiento, hormigueo o nota una prdida de fuerza en la zona lesionada.  ASEGRESE DE QUE:  Comprende estas instrucciones.  Controlar su afeccin.  Recibir ayuda de inmediato si no mejora o si empeora.  Esta informacin no tiene como fin reemplazar el consejo del mdico. Asegrese de hacerle al mdico cualquier pregunta que tenga. Document Released: 12/25/2008 Document Revised: 07/19/2013 Document Reviewed: 04/27/2013 Elsevier Interactive Patient Education  2017 Elsevier Inc.  

## 2018-01-31 ENCOUNTER — Telehealth (INDEPENDENT_AMBULATORY_CARE_PROVIDER_SITE_OTHER): Payer: Self-pay | Admitting: Physician Assistant

## 2018-01-31 NOTE — Telephone Encounter (Signed)
No letters made without evaluation. Please schedule OV. Thanks.

## 2018-01-31 NOTE — Telephone Encounter (Signed)
FWD to PCP. Joline Encalada S Jenkins Risdon, CMA  

## 2018-01-31 NOTE — Telephone Encounter (Signed)
Patient  is requesting a letter stating that she needs to have restrictions placed on her current job function. 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

## 2018-02-01 NOTE — Telephone Encounter (Signed)
Noted. Thanks.

## 2018-02-02 ENCOUNTER — Ambulatory Visit (INDEPENDENT_AMBULATORY_CARE_PROVIDER_SITE_OTHER): Payer: Self-pay | Admitting: Physician Assistant

## 2018-02-15 ENCOUNTER — Ambulatory Visit (INDEPENDENT_AMBULATORY_CARE_PROVIDER_SITE_OTHER): Payer: Self-pay | Admitting: Physician Assistant

## 2018-02-15 ENCOUNTER — Encounter (INDEPENDENT_AMBULATORY_CARE_PROVIDER_SITE_OTHER): Payer: Self-pay | Admitting: Physician Assistant

## 2018-02-15 ENCOUNTER — Telehealth (INDEPENDENT_AMBULATORY_CARE_PROVIDER_SITE_OTHER): Payer: Self-pay | Admitting: Physician Assistant

## 2018-02-15 VITALS — BP 122/83 | HR 81 | Temp 98.1°F | Resp 18 | Ht 59.0 in | Wt 183.0 lb

## 2018-02-15 DIAGNOSIS — M542 Cervicalgia: Secondary | ICD-10-CM

## 2018-02-15 DIAGNOSIS — K5900 Constipation, unspecified: Secondary | ICD-10-CM

## 2018-02-15 DIAGNOSIS — F411 Generalized anxiety disorder: Secondary | ICD-10-CM

## 2018-02-15 DIAGNOSIS — R350 Frequency of micturition: Secondary | ICD-10-CM

## 2018-02-15 DIAGNOSIS — R109 Unspecified abdominal pain: Secondary | ICD-10-CM

## 2018-02-15 DIAGNOSIS — R300 Dysuria: Secondary | ICD-10-CM

## 2018-02-15 LAB — POCT URINALYSIS DIPSTICK
BILIRUBIN UA: NEGATIVE
GLUCOSE UA: NEGATIVE
Ketones, UA: NEGATIVE
Leukocytes, UA: NEGATIVE
Nitrite, UA: NEGATIVE
Protein, UA: NEGATIVE
RBC UA: NEGATIVE
SPEC GRAV UA: 1.015 (ref 1.010–1.025)
Urobilinogen, UA: 0.2 E.U./dL
pH, UA: 6 (ref 5.0–8.0)

## 2018-02-15 MED ORDER — NAPROXEN 500 MG PO TABS: 500.0000 mg | ORAL_TABLET | Freq: Two times a day (BID) | ORAL | 1 refills | Status: DC

## 2018-02-15 MED ORDER — SERTRALINE HCL 25 MG PO TABS: 25.0000 mg | ORAL_TABLET | Freq: Every day | ORAL | 5 refills | Status: DC

## 2018-02-15 MED ORDER — SENNOSIDES-DOCUSATE SODIUM 8.6-50 MG PO TABS: 1.0000 | ORAL_TABLET | Freq: Every day | ORAL | 1 refills | Status: DC

## 2018-02-15 NOTE — Telephone Encounter (Signed)
Please follow up with pt on her CAFA and OC status

## 2018-02-15 NOTE — Patient Instructions (Signed)
Estreimiento en los adultos Constipation, Adult Se llama estreimiento cuando:  Tiene deposiciones (defeca) una menor cantidad de veces a la semana de lo normal.  Tiene dificultad para defecar.  Las heces son secas y duras o son ms grandes que lo normal.  Siga estas indicaciones en su casa: Comida y bebida   Consuma alimentos con alto contenido de fibra, por ejemplo: ? Frutas y verduras frescas. ? Cereales integrales. ? Frijoles.  Consuma una menor cantidad de alimentos ricos en grasas, con bajo contenido de fibra o excesivamente procesados, como: ? Papas fritas. ? Hamburguesas. ? Galletas. ? Caramelos. ? Gaseosas.  Beba suficiente lquido para mantener el pis (orina) claro o de color amarillo plido. Instrucciones generales  Haga actividad fsica con regularidad o segn las indicaciones del mdico.  Vaya al bao cuando sienta la necesidad de defecar. No se aguante las ganas.  Tome los medicamentos de venta libre y los recetados solamente como se lo haya indicado el mdico. Estos incluyen los suplementos de fibra.  Realice ejercicios de reentrenamiento del suelo plvico, como: ? Respirar profundamente mientras relaja la parte inferior del vientre (abdomen). ? Relajar el suelo plvico mientras defeca.  Controle su afeccin para ver si hay cambios.  Concurra a todas las visitas de control como se lo haya indicado el mdico. Esto es importante. Comunquese con un mdico si:  Siente un dolor que empeora.  Tiene fiebre.  No ha defecado por 4das.  Vomita.  No tiene hambre.  Pierde peso.  Tiene una hemorragia en el ano.  Las deposiciones (heces) son delgadas como un lpiz. Solicite ayuda de inmediato si:  Tiene fiebre, y los sntomas empeoran de repente.  Tiene prdida de materia fecal u observa sangre en las heces.  Siente el vientre ms duro o ms grande de lo normal (est hinchado).  Siente un dolor muy intenso en el vientre.  Se siente mareado o  se desmaya. Esta informacin no tiene como fin reemplazar el consejo del mdico. Asegrese de hacerle al mdico cualquier pregunta que tenga. Document Released: 10/31/2010 Document Revised: 12/30/2016 Document Reviewed: 03/18/2016 Elsevier Interactive Patient Education  2018 Elsevier Inc.  

## 2018-02-15 NOTE — Progress Notes (Signed)
Subjective:  Patient ID: Nina Hawkins, female    DOB: [birth year]  Age: 47 y.o. MRN: [id number]  CC: f/u  HPI   Nina Martinezis a 47 y.o.femalewith a PMH of prediabetes, chronic left sided LBP and left sided cervicalgia presents to f/u on left flank pain. Prescribed cyclobenzaprine which helped relieve left flank pain. Says she is chronically constipated and anxious. Currently has generalized discomfort in all quadrants.     Says she still has cervicalgia. Has taken Naproxen and Cyclobenzaprine with temporary relief. Would like to have a work note written that states she can go back to work without restrictions despite telling me she is still in pain. Has been out of work for two months now and is having financial difficulties.    Outpatient Medications Prior to Visit  Medication Sig Dispense Refill  . cyclobenzaprine (FLEXERIL) 5 MG tablet Take 1 tablet (5 mg total) by mouth 3 (three) times daily as needed for muscle spasms. 60 tablet 1  . ibuprofen (ADVIL,MOTRIN) 600 MG tablet Take 1 tablet (600 mg total) by mouth every 8 (eight) hours as needed. 60 tablet 1  . Probiotic Product (RESTORA) CAPS Take 1 capsule by mouth daily. (Patient not taking: Reported on 01/26/2018) 30 capsule 1   No facility-administered medications prior to visit.      ROS Review of Systems  Constitutional: Negative for chills, fever and malaise/fatigue.  Eyes: Negative for blurred vision.  Respiratory: Negative for shortness of breath.   Cardiovascular: Negative for chest pain and palpitations.  Gastrointestinal: Positive for constipation. Negative for abdominal pain and nausea.  Genitourinary: Negative for dysuria and hematuria.  Musculoskeletal: Positive for neck pain. Negative for joint pain and myalgias.  Skin: Negative for rash.  Neurological: Negative for tingling and headaches.  Psychiatric/Behavioral: Negative for depression. The patient is nervous/anxious.     Objective:  BP 122/83 (BP  Location: Left Arm, Patient Position: Sitting, Cuff Size: Large)   Pulse 81   Temp 98.1 F (36.7 C) (Oral)   Resp 18   Ht  (1.499 m)   Wt 183 lb (83 kg)   SpO2 98%   BMI 36.96 kg/m   BP/Weight 02/15/2018 01/26/2018 01/06/2018  Systolic BP 122 140 145  Diastolic BP 83 91 95  Wt. (Lbs) 183 172.6 165.4  BMI 36.96 37.35 36.43      Physical Exam  Constitutional: She is oriented to person, place, and time.  Well developed, well nourished, NAD, polite  HENT:  Head: Normocephalic and atraumatic.  Eyes: No scleral icterus.  Neck: Normal range of motion. Neck supple. No thyromegaly present.  Cardiovascular: Normal rate, regular rhythm and normal heart sounds.  Pulmonary/Chest: Effort normal and breath sounds normal.  Abdominal: Soft. Bowel sounds are normal. There is tenderness (mild generalized TTP in all quadrants.).  Musculoskeletal: She exhibits no edema.  Increased muscular tonicity of the bilateral upper trapezius.  Neurological: She is alert and oriented to person, place, and time.  Skin: Skin is warm and dry. No rash noted. No erythema. No pallor.  Psychiatric: She has a normal mood and affect. Her behavior is normal. Thought content normal.  Vitals reviewed.    Assessment & Plan:    1. Constipation, unspecified constipation type - sertraline (ZOLOFT) 25 MG tablet; Take 1 tablet (25 mg total) by mouth daily.  Dispense: 30 tablet; Refill: 5 - senna-docusate (SENOKOT-S) 8.6-50 MG tablet; Take 1 tablet by mouth daily.  Dispense: 30 tablet; Refill: 1  2. Left flank pain - Urinalysis  Dipstick negative - naproxen (NAPROSYN) 500 MG tablet; Take 1 tablet (500 mg total) by mouth 2 (two) times daily with a meal.  Dispense: 30 tablet; Refill: 1  3. GAD (generalized anxiety disorder) - sertraline (ZOLOFT) 25 MG tablet; Take 1 tablet (25 mg total) by mouth daily.  Dispense: 30 tablet; Refill: 5  4. Cervicalgia - Ambulatory referral to Physical Therapy  5. Urinary  frequency - Urinalysis Dipstick negative  6. Dysuria - Urinalysis Dipstick negative   Meds ordered this encounter  Medications  . sertraline (ZOLOFT) 25 MG tablet    Sig: Take 1 tablet (25 mg total) by mouth daily.    Dispense:  30 tablet    Refill:  5    Order Specific Question:   Supervising Provider    Answer:   Quentin Angst [id number]  . senna-docusate (SENOKOT-S) 8.6-50 MG tablet    Sig: Take 1 tablet by mouth daily.    Dispense:  30 tablet    Refill:  1    Order Specific Question:   Supervising Provider    Answer:   Quentin Angst [id number]  . naproxen (NAPROSYN) 500 MG tablet    Sig: Take 1 tablet (500 mg total) by mouth 2 (two) times daily with a meal.    Dispense:  30 tablet    Refill:  1    Order Specific Question:   Supervising Provider    Answer:   Quentin Angst [[id number]]    Follow-up: Return in about 1 month (around 03/15/2018) for Cervicalgia.   Loletta Specter PA

## 2018-02-23 ENCOUNTER — Encounter: Payer: Self-pay | Admitting: Physical Therapy

## 2018-02-23 ENCOUNTER — Other Ambulatory Visit: Payer: Self-pay

## 2018-02-23 ENCOUNTER — Ambulatory Visit: Payer: Self-pay | Attending: Physician Assistant | Admitting: Physical Therapy

## 2018-02-23 DIAGNOSIS — M542 Cervicalgia: Secondary | ICD-10-CM | POA: Insufficient documentation

## 2018-02-23 DIAGNOSIS — R252 Cramp and spasm: Secondary | ICD-10-CM | POA: Insufficient documentation

## 2018-02-23 DIAGNOSIS — R293 Abnormal posture: Secondary | ICD-10-CM | POA: Insufficient documentation
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
## 2018-02-23 NOTE — Patient Instructions (Signed)
Axial Extension (Chin Tuck)    Hunda el mentn y alargue la parte posterior del cuello. Mantenga _3___ segundos mientras cuenta en voz alta. Repita __10__ veces. Haga _2___ sesiones por da.  http://gt2.exer.us/450   Copyright  VHI. All rights reserved.    Scapular Retraction (Standing)    Con brazos a los lados del cuerpo, [employer]. . Realice __10__ Carlis Stable por sesin. Realice __2__ sesiones por da.  http://orth.exer.us/945   Copyright  VHI. All rights reserved.   Shoulder Roll    [employer] hombros [employer], [place], atrs y [place]. Continu girando los [employer] atrs _10__ veces. Repita, girando los hombros [employer]. Realice __2_ veces al da.  Copyright  VHI. All rights reserved.

## 2018-03-01 ENCOUNTER — Ambulatory Visit: Payer: Self-pay | Admitting: Physical Therapy

## 2018-03-01 ENCOUNTER — Telehealth: Payer: Self-pay | Admitting: Physical Therapy

## 2018-03-01 NOTE — Telephone Encounter (Signed)
No-show. Asked interpreter to call patient, as she is Spanish speaking, regarding this no-show as well as time/date of next appointment.    Nedra Hai PT, DPT, CBIS  Supplemental Physical Therapist [facility]   Pager [phone]

## 2018-03-03 ENCOUNTER — Ambulatory Visit: Payer: Self-pay | Admitting: Physical Therapy

## 2018-03-03 ENCOUNTER — Encounter: Payer: Self-pay | Admitting: Physical Therapy

## 2018-03-03 DIAGNOSIS — R252 Cramp and spasm: Secondary | ICD-10-CM

## 2018-03-03 DIAGNOSIS — R293 Abnormal posture: Secondary | ICD-10-CM

## 2018-03-03 DIAGNOSIS — M542 Cervicalgia: Secondary | ICD-10-CM
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
## 2018-03-10 ENCOUNTER — Ambulatory Visit: Payer: Self-pay | Admitting: Physical Therapy

## 2018-03-10 ENCOUNTER — Encounter: Payer: Self-pay | Admitting: Physical Therapy

## 2018-03-10 DIAGNOSIS — R293 Abnormal posture: Secondary | ICD-10-CM

## 2018-03-10 DIAGNOSIS — R252 Cramp and spasm: Secondary | ICD-10-CM

## 2018-03-10 DIAGNOSIS — M542 Cervicalgia: Secondary | ICD-10-CM

## 2018-03-10 NOTE — Therapy (Signed)
[place], [place], [address] Phone: [phone]   Fax:  [phone]  Physical Therapy Treatment  Patient Details  Name: Nina Hawkins MRN: [id number] Date of Birth: [birth year] Referring Provider: Clent Demark    Encounter Date: 03/10/2018    History reviewed. No pertinent past medical history.  Past Surgical History:  Procedure Laterality Date  . ABDOMINAL HYSTERECTOMY      There were no vitals filed for this visit.  Subjective Assessment - 03/10/18 1336    Subjective  Muscles are tight I am so,so.  i do the exercises 2 x a day,  exercises relaxer.  Sleeping 2 days a little better on the left side    Patient is accompained by:  Interpreter    Currently in Pain?  Yes    Pain Score  5     Pain Location  Neck    Pain Orientation  Left    Pain Descriptors / Indicators  Tightness    Pain Radiating Towards  down arm to posterior ribs, and back  one day last week it ran down into left leg    Pain Relieving Factors  exercises,  sleeping sometimes no pain and sometimes some pain.     Effect of Pain on Daily Activities  Does everything with pain.                        [facility] Adult PT Treatment/Exercise - 03/10/18 0001      Neck Exercises: Supine   Neck Retraction  5 reps    Cervical Rotation  5 reps    Other Supine Exercise  dEAD BUG 5 x alternating Opposites of UE/LE, mod assist with coordination.     Other Supine Exercise  supine cervical stabilization series circles, horizontal ab/ add,  flex/extension flexion left limited by pain      Modalities   Modalities  Moist Heat      Moist Heat Therapy   Number Minutes Moist Heat  15 Minutes    Moist Heat Location  Cervical      Manual Therapy   Manual therapy comments  patient supine soft tissue work posterior cervical,  gentle distraction reduced arm pain,  stw peri scapular noted tender, tight teres.  tissue loosened.  rib lifts  helpful vs pressing anterior posterior ( helped with left Pec pain .  Spasm not noted in PEC,  other tissue softened.              PT Education - 03/10/18 1632    Education provided  Yes    Education Details  exercise form    Person(s) Educated  Patient    Methods  Explanation    Comprehension  Verbalized understanding;Need further instruction       PT Short Term Goals - 03/10/18 1633      PT SHORT TERM GOAL #1   Title  Patient to be compliant with correct performance of HEP, to be updated PRN     Baseline  compliant at home.    Time  3    Period  Weeks    Status  Partially Met      PT SHORT TERM GOAL #2   Title  Patient to report radicular symptoms in L UE only going as far as her elbow in order to show improvement of condition     Baseline  just distal to elbow    Time  3  Period  Weeks    Status  On-going      PT SHORT TERM GOAL #3   Title  Patient to be able to maintain correct upright posture 75% of the time without verbal cues in order to assist in correcting alignment and reducing pain     Time  3    Period  Weeks    Status  Unable to assess        PT Long Term Goals - 02/23/18 1518      PT LONG TERM GOAL #1   Title  Patient to demonstrate cervical and shoulder ROM as being full and pain free in order to show improved motion and mobility     Time  6    Period  Weeks    Status  New    Target Date  04/06/18      PT LONG TERM GOAL #2   Title  Patient to report pain as being no more than 2/10 at worst in neck and including tasks that involve overhead reaching in order to improve work tolerance and QOL     Time  6    Period  Weeks    Status  New      PT LONG TERM GOAL #3   Title  Patient to report resolution of radicular symptoms in order to show improvement of overall condition     Time  6    Period  Weeks    Status  New      PT LONG TERM GOAL #4   Title  Patient to demonstrate at least a 50% reduction in muscle spasms and trigger points to  assist in reducing pain and to show general improvement of condition     Time  6    Period  Weeks    Status  New              Patient will benefit from skilled therapeutic intervention in order to improve the following deficits and impairments:     Visit Diagnosis: Cervicalgia  Abnormal posture  Cramp and spasm     Problem List Patient Active Problem List   Diagnosis Date Noted  . Facial laceration 12/24/2017  . H. pylori infection 02/03/2017  . Cervicalgia 02/02/2017  . Abdominal pain, epigastric 02/02/2017  . Chronic left-sided low back pain without sciatica 02/02/2017    [facility] PTA 03/10/2018, 4:36 PM  [place], [place], [address] Phone: [phone]   Fax:  [phone]  Name: Nina Hawkins MRN: [id number] Date of Birth: January 19, 1971

## 2018-03-14 ENCOUNTER — Encounter: Payer: Self-pay | Admitting: Physical Therapy

## 2018-03-14 ENCOUNTER — Ambulatory Visit: Payer: Self-pay | Attending: Physician Assistant | Admitting: Physical Therapy

## 2018-03-14 DIAGNOSIS — M542 Cervicalgia: Secondary | ICD-10-CM | POA: Insufficient documentation

## 2018-03-14 DIAGNOSIS — M5442 Lumbago with sciatica, left side: Secondary | ICD-10-CM | POA: Insufficient documentation

## 2018-03-14 DIAGNOSIS — R293 Abnormal posture: Secondary | ICD-10-CM | POA: Insufficient documentation

## 2018-03-14 DIAGNOSIS — R252 Cramp and spasm: Secondary | ICD-10-CM | POA: Insufficient documentation

## 2018-03-14 DIAGNOSIS — M6281 Muscle weakness (generalized): Secondary | ICD-10-CM | POA: Insufficient documentation

## 2018-03-14 DIAGNOSIS — M62838 Other muscle spasm: Secondary | ICD-10-CM | POA: Insufficient documentation

## 2018-03-14 DIAGNOSIS — G8929 Other chronic pain: Secondary | ICD-10-CM | POA: Insufficient documentation

## 2018-03-14 NOTE — Therapy (Signed)
[place], [place], [address] Phone: [phone]   Fax:  [phone]  Physical Therapy Treatment  Patient Details  Name: Nina Hawkins MRN: [id number] Date of Birth: [birth year] Referring Provider: Clent Demark    Encounter Date: 03/14/2018  PT End of Session - 03/14/18 0859    Visit Number  4    Number of Visits  13    Date for PT Re-Evaluation  03/23/18    Authorization Type  [employer] (01/11/18 through 07/13/18)    Authorization Time Period  02/23/18 to 04/06/18    PT Start Time  0817 patient arrived late, then moist heat not included in billing     PT Stop Time  0855    PT Time Calculation (min)  38 min    Activity Tolerance  Patient tolerated treatment well    Behavior During Therapy  [facility] for tasks assessed/performed       History reviewed. No pertinent past medical history.  Past Surgical History:  Procedure Laterality Date  . ABDOMINAL HYSTERECTOMY      There were no vitals filed for this visit.  Subjective Assessment - 03/14/18 0811    Subjective  I am feeling achey like I got a workout, it bothered me all weekend. I feel like I am getting some better, some things are the same, some days are better, some days I still hurt. Heat helps.     Patient Stated Goals  get rid of pain     Currently in Pain?  Yes    Pain Score  6     Pain Location  Neck    Pain Orientation  Left    Pain Descriptors / Indicators  Aching;Sore    Pain Type  Chronic pain    Pain Radiating Towards  down arm and down L side of body                        [facility] Adult PT Treatment/Exercise - 03/14/18 0001      Neck Exercises: Supine   Neck Retraction  10 reps;Other (comment) supine, pushing back of head into pillow     Cervical Rotation  10 reps with chin tuck     Shoulder Flexion  10 reps;Other (comment) with chin tuck    Shoulder ABduction  10 reps;Other (comment) with chin tuck     Other Supine Exercise   serratus punches with corresponding chin tuck 1x10 B       Moist Heat Therapy   Number Minutes Moist Heat  8 Minutes not included in billing     Moist Heat Location  Cervical      Manual Therapy   Manual Therapy  Soft tissue mobilization    Manual therapy comments  separate from all other skilled esrvices     Soft tissue mobilization  STM L scalenes, upper trap, levator              PT Education - 03/14/18 0858    Education provided  Yes    Education Details  self-sub occipital release with tennis ball, exercise form     Person(s) Educated  Patient    Methods  Explanation    Comprehension  Verbalized understanding;Need further instruction       PT Short Term Goals - 03/10/18 1633      PT SHORT TERM GOAL #1   Title  Patient to be compliant with correct performance of HEP, to be  updated PRN     Baseline  compliant at home.    Time  3    Period  Weeks    Status  Partially Met      PT SHORT TERM GOAL #2   Title  Patient to report radicular symptoms in L UE only going as far as her elbow in order to show improvement of condition     Baseline  just distal to elbow    Time  3    Period  Weeks    Status  On-going      PT SHORT TERM GOAL #3   Title  Patient to be able to maintain correct upright posture 75% of the time without verbal cues in order to assist in correcting alignment and reducing pain     Time  3    Period  Weeks    Status  Unable to assess        PT Long Term Goals - 02/23/18 1518      PT LONG TERM GOAL #1   Title  Patient to demonstrate cervical and shoulder ROM as being full and pain free in order to show improved motion and mobility     Time  6    Period  Weeks    Status  New    Target Date  04/06/18      PT LONG TERM GOAL #2   Title  Patient to report pain as being no more than 2/10 at worst in neck and including tasks that involve overhead reaching in order to improve work tolerance and QOL     Time  6    Period  Weeks    Status  New       PT LONG TERM GOAL #3   Title  Patient to report resolution of radicular symptoms in order to show improvement of overall condition     Time  6    Period  Weeks    Status  New      PT LONG TERM GOAL #4   Title  Patient to demonstrate at least a 50% reduction in muscle spasms and trigger points to assist in reducing pain and to show general improvement of condition     Time  [place] - 03/14/18 0859    Clinical Impression Statement  Patient arrives late today, reports that so far moist heat and soft tissue work have been helping. She continues to express interest in dry needling to assist in addressing her pain. Placed patient on moist heat per her request as it has been helpful in reducing pain (not included in billing), then proceeded with functional exercises and STM as tolerated this session. Patient continues to remain appropriate for trial of dry needling.     Rehab Potential  Good    Clinical Impairments Affecting Rehab Potential  (+) motivated to participate, success with PT in the past; (-) chronicity of pain     PT Frequency  2x / week    PT Duration  6 weeks    PT Treatment/Interventions  ADLs/Self Care Home Management;Biofeedback;Cryotherapy;Electrical Stimulation;Iontophoresis 3m/ml Dexamethasone;Moist Heat;Traction;Ultrasound;DME Instruction;Functional mobility training;Therapeutic activities;Therapeutic exercise;Balance training;Neuromuscular re-education;Patient/family education;Manual techniques;Passive range of motion;Dry needling;Energy conservation;Taping    PT Next Visit Plan  continue to progress exercise/activities as able, DN. What does her new job entail?     PT Home Exercise Plan  Eval: chin tucks, scap retractions, shoulder rolls .  Use  improved posture with ADL's    Consulted and Agree with Plan of Care  Patient       Patient will benefit from skilled therapeutic intervention in order to improve the following  deficits and impairments:  Increased fascial restricitons, Improper body mechanics, Pain, Decreased coordination, Decreased mobility, Increased muscle spasms, Postural dysfunction, Decreased activity tolerance, Decreased strength, Hypomobility, Impaired UE functional use, Impaired flexibility  Visit Diagnosis: Cervicalgia  Abnormal posture  Cramp and spasm     Problem List Patient Active Problem List   Diagnosis Date Noted  . Facial laceration 12/24/2017  . H. pylori infection 02/03/2017  . Cervicalgia 02/02/2017  . Abdominal pain, epigastric 02/02/2017  . Chronic left-sided low back pain without sciatica 02/02/2017    Deniece Ree PT, DPT, CBIS  Supplemental Physical Therapist [place]   Pager [facility] [address] [place], [place], [address] Phone: [phone]   Fax:  [phone]  Name: Katerin Negrete MRN: [id number] Date of Birth: 10-Jun-1971

## 2018-03-17 ENCOUNTER — Encounter: Payer: Self-pay | Admitting: Physical Therapy

## 2018-03-17 ENCOUNTER — Ambulatory Visit: Payer: Self-pay | Admitting: Physical Therapy

## 2018-03-17 DIAGNOSIS — R252 Cramp and spasm: Secondary | ICD-10-CM

## 2018-03-17 DIAGNOSIS — M542 Cervicalgia: Secondary | ICD-10-CM

## 2018-03-17 DIAGNOSIS — R293 Abnormal posture: Secondary | ICD-10-CM

## 2018-03-17 NOTE — Therapy (Signed)
[place], [place], [address] Phone: [phone]   Fax:  [phone]  Physical Therapy Treatment  Patient Details  Name: Nina Hawkins MRN: [id number] Date of Birth: April 26, 1971 Referring Provider: Clent Demark    Encounter Date: 03/17/2018  PT End of Session - 03/17/18 0931    Visit Number  5    Number of Visits  13    Date for PT Re-Evaluation  03/23/18    PT Start Time  0846    PT Stop Time  0945    PT Time Calculation (min)  59 min    Activity Tolerance  Patient tolerated treatment well    Behavior During Therapy  [facility] for tasks assessed/performed       History reviewed. No pertinent past medical history.  Past Surgical History:  Procedure Laterality Date  . ABDOMINAL HYSTERECTOMY      There were no vitals filed for this visit.  Subjective Assessment - 03/17/18 0852    Subjective  sTARTED NEW jOB hAD A LOT OF BENDING AND LIFTING,  iT HURTS A LOT.  sOME DAYS IT DOES NOT HURT.      Patient is accompained by:  Interpreter    Currently in Pain?  Yes    Pain Score  5     Pain Location  Neck    Pain Orientation  Left    Pain Descriptors / Indicators  Aching;Sore;Constant    Pain Type  Chronic pain    Pain Radiating Towards  JUST BELOW ELBOW    Pain Frequency  Constant VARIES.    Aggravating Factors   nOW JOB BENDOING OVER    Pain Relieving Factors  EXERCISES,  MEDS         [facility] PT Assessment - 03/17/18 0001      AROM   Overall AROM   -- LT shoul WNL pain end abduction    Cervical Flexion  -- 2 fingers with from chest      Cervical Extension  -- WNL,  end randg pain a little    Cervical - Right Side Bend  22 a little pain    Cervical - Left Side Bend  -- 35,  a little pain    Cervical - Right Rotation  70    Cervical - Left Rotation  60                   [facility] Adult PT Treatment/Exercise - 03/17/18 0001      Neck Exercises: Machines for Strengthening   Other  Machines for Strengthening  nU STEP   L5,       Neck Exercises: Seated   Cervical Rotation  5 reps some end range pain,  ROM improving with reps.      Lateral Flexion  5 reps some end range pain      Neck Exercises: Supine   Neck Retraction  10 reps    Other Supine Exercise  10 with 2 1 LBS    Other Supine Exercise  supine cervical stabilization series circles, horizontal ab/ add,  flex/extension 1 LBS hpriz add/ Abd.  Flexion left end range pain      Modalities   Modalities  Moist Heat      Moist Heat Therapy   Number Minutes Moist Heat  15 Minutes    Moist Heat Location  Cervical;Shoulder             PT Education - 03/17/18 7253  Education provided  Yes    Education Details  importance of goo posture    Person(s) Educated  Patient    Methods  Explanation    Comprehension  Verbalized understanding       PT Short Term Goals - 03/17/18 1034      PT SHORT TERM GOAL #1   Title  Patient to be compliant with correct performance of HEP, to be updated PRN     Baseline  no questions about HEP  today,  she is compliant    Time  4    Period  Weeks    Status  Partially Met      PT SHORT TERM GOAL #2   Title  Patient to report radicular symptoms in L UE only going as far as her elbow in order to show improvement of condition     Baseline  just distal to elbow,  sometimes not in arm    Time  3    Period  Weeks    Status  On-going      PT SHORT TERM GOAL #3   Title  Patient to be able to maintain correct upright posture 75% of the time without verbal cues in order to assist in correcting alignment and reducing pain     Baseline  not using correct posture at work    Time  3    Period  Weeks    Status  On-going        PT Long Term Goals - 02/23/18 1518      PT LONG TERM GOAL #1   Title  Patient to demonstrate cervical and shoulder ROM as being full and pain free in order to show improved motion and mobility     Time  6    Period  Weeks    Status  New    Target  Date  04/06/18      PT LONG TERM GOAL #2   Title  Patient to report pain as being no more than 2/10 at worst in neck and including tasks that involve overhead reaching in order to improve work tolerance and QOL     Time  6    Period  Weeks    Status  New      PT LONG TERM GOAL #3   Title  Patient to report resolution of radicular symptoms in order to show improvement of overall condition     Time  6    Period  Weeks    Status  New      PT LONG TERM GOAL #4   Title  Patient to demonstrate at least a 50% reduction in muscle spasms and trigger points to assist in reducing pain and to show general improvement of condition     Time  6    Period  Weeks    Status  New            Plan - 03/17/18 8921    Clinical Impression Statement  Cervical ROM improving, see flow sheet especially rotation.  Pain 4/10 at end of session.  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    PT Next Visit Plan  continue to progress exercise/activities as able, DN.  Consider light band  ex .    PT Home Exercise Plan  : chin tucks, scap retractions, shoulder rolls .  Use  improved posture with ADL's    Consulted and Agree with Plan of Care  Patient       Patient will benefit from skilled therapeutic intervention in order to improve the following deficits and impairments:     Visit Diagnosis: Cervicalgia  Abnormal posture  Cramp and spasm     Problem List Patient Active Problem List   Diagnosis Date Noted  . Facial laceration 12/24/2017  . H. pylori infection 02/03/2017  . Cervicalgia 02/02/2017  . Abdominal pain, epigastric 02/02/2017  . Chronic left-sided low back pain without sciatica 02/02/2017    [facility] PTA 03/17/2018, 10:38 AM  [place], [place], [address] Phone: [phone]   Fax:  [phone]  Name: Nina Hawkins MRN: [id number] Date of Birth: [birth year]

## 2018-03-21 ENCOUNTER — Ambulatory Visit: Payer: Self-pay | Admitting: Physical Therapy

## 2018-03-21 ENCOUNTER — Encounter: Payer: Self-pay | Admitting: Physical Therapy

## 2018-03-21 DIAGNOSIS — M542 Cervicalgia: Secondary | ICD-10-CM

## 2018-03-21 DIAGNOSIS — R293 Abnormal posture: Secondary | ICD-10-CM

## 2018-03-21 DIAGNOSIS — R252 Cramp and spasm: Secondary | ICD-10-CM

## 2018-03-21 NOTE — Therapy (Signed)
[facility] [address] [place], [place], [address] Phone: [phone]   Fax:  [phone]  Physical Therapy Treatment (ERO)  Patient Details  Name: Nina Hawkins MRN: [id number] Date of Birth: Jul 02, 1971 Referring Provider: Loletta Specter    Encounter Date: 03/21/2018  PT End of Session - 03/21/18 1014    Visit Number  6    Number of Visits  13    Date for PT Re-Evaluation  04/06/18    Authorization Type  [employer] (01/11/18 through 07/13/18)    Authorization Time Period  02/23/18 to 04/06/18    PT Start Time  0932    PT Stop Time  1006 heat not included in billing     PT Time Calculation (min)  34 min    Activity Tolerance  Patient tolerated treatment well    Behavior During Therapy  [facility] for tasks assessed/performed       History reviewed. No pertinent past medical history.  Past Surgical History:  Procedure Laterality Date  . ABDOMINAL HYSTERECTOMY      There were no vitals filed for this visit.  Subjective Assessment - 03/21/18 0936    Subjective  Yesterday I woke up with a lot of pain, I am feeling so-so. In the big picture I do not feel like I'm getting better. Some days it does not hurt, some days it does. It feels like its burning deep in my neck.  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     How long can you sit comfortably?  6/10- constant pain     How long can you stand comfortably?  6/10- constant pain     How long can you walk comfortably?  6/10- constant pain     Patient Stated Goals  get rid of pain     Currently in Pain?  Yes    Pain Score  4     Pain Location  Neck    Pain Orientation  Right;Left    Pain Descriptors / Indicators  Aching;Constant;Burning    Pain Type  Chronic pain    Pain Radiating Towards  down to just below elbow, whole arm feels weak          [facility] PT Assessment - 03/21/18 0001       AROM   Cervical Flexion  18    Cervical Extension  31    Cervical - Right Side Bend  30    Cervical - Left Side Bend  30    Cervical - Right Rotation  60    Cervical - Left Rotation  60      Palpation   Palpation comment  very tense, mulitple trigger points noted                    [facility] Adult PT Treatment/Exercise - 03/21/18 0001      Neck Exercises: Supine   Neck Retraction  10 reps 3 second holds on heat       Modalities   Modalities  Moist Heat      Moist Heat Therapy   Number Minutes Moist Heat  8 Minutes not included in billing     Moist Heat Location  Cervical             PT Education - 03/21/18 1014    Education provided  Yes    Education Details  goal review, POC moving forward, DN, possible referral back to MD if not improved on 6/27 after she has had 2 DN sessions     Person(s) Educated  Patient    Methods  Explanation    Comprehension  Verbalized understanding       PT Short Term Goals - 03/21/18 0950      PT SHORT TERM GOAL #1   Title  Patient to be compliant with correct performance of HEP, to be updated PRN     Baseline  6/10- going well, doing 2x/day     Time  4    Period  Weeks    Status  Achieved      PT SHORT TERM GOAL #2   Title  Patient to report radicular symptoms in L UE only going as far as her elbow in order to show improvement of condition     Baseline  6/10- still going into arm past elbow     Time  3    Period  Weeks    Status  On-going      PT SHORT TERM GOAL #3   Title  Patient to be able to maintain correct upright posture 75% of the time without verbal cues in order to assist in correcting alignment and reducing pain     Baseline  6/10 ongoing postural deficits     Time  3    Period  Weeks    Status  On-going        PT Long Term Goals - 03/21/18 0953      PT LONG TERM GOAL #1   Title  Patient to demonstrate cervical and shoulder ROM as being full and pain free in order to show improved motion and  mobility     Baseline  6/10- flowsheets     Time  6    Period  Weeks    Status  On-going      PT LONG TERM GOAL #2   Title  Patient to report pain as being no more than 2/10 at worst in neck and including tasks that involve overhead reaching in order to improve work tolerance and QOL     Baseline  6/10- 4/10 ongoing pain     Time  6    Period  Weeks    Status  On-going      PT LONG TERM GOAL #3   Title  Patient to report resolution of radicular symptoms in order to show improvement of overall condition     Baseline  6/10- ongoing     Period  Weeks    Status  On-going      PT LONG TERM GOAL #4   Title  Patient to demonstrate at least a 50% reduction in muscle spasms and trigger points to assist in reducing pain and to show general improvement of condition     Baseline  6/10- ongoing     Time  6    Period  Weeks    Status  On-going            Plan - 03/21/18 1014    Clinical Impression Statement  Re-assessment performed today. Patient has been compliant with HEP however continues to be significant limited by pain and muscle spasm, continues to demonstrate significant limitations in cervical ROM as well as functional activity tolerance due to pain. She reports no significant relief with PT thus far however is interested in continuing to attempt dry  needling for pain relief. Recommend brief continuation of skilled PT services for ongoing skilled intervention as well as trial of dry needling; plan to closely monitor patient and recommend possible DC at next re-assessment if no further progress has been made.      Rehab Potential  Good    Clinical Impairments Affecting Rehab Potential  (+) motivated to participate, success with PT in the past; (-) chronicity of pain     PT Frequency  2x / week    PT Duration  Other (comment) up to 6/26     PT Treatment/Interventions  ADLs/Self Care Home Management;Biofeedback;Cryotherapy;Electrical Stimulation;Iontophoresis 4mg /ml Dexamethasone;Moist  Heat;Traction;Ultrasound;DME Instruction;Functional mobility training;Therapeutic activities;Therapeutic exercise;Balance training;Neuromuscular re-education;Patient/family education;Manual techniques;Passive range of motion;Dry needling;Energy conservation;Taping    PT Next Visit Plan  DN, continue to progress as able. Reassess and possible DC on 6/26 if no improvement following DN sessions     PT Home Exercise Plan  : chin tucks, scap retractions, shoulder rolls .  Use  improved posture with ADL's    Consulted and Agree with Plan of Care  Patient       Patient will benefit from skilled therapeutic intervention in order to improve the following deficits and impairments:  Increased fascial restricitons, Improper body mechanics, Pain, Decreased coordination, Decreased mobility, Increased muscle spasms, Postural dysfunction, Decreased activity tolerance, Decreased strength, Hypomobility, Impaired UE functional use, Impaired flexibility  Visit Diagnosis: Cervicalgia  Abnormal posture  Cramp and spasm     Problem List Patient Active Problem List   Diagnosis Date Noted  . Facial laceration 12/24/2017  . H. pylori infection 02/03/2017  . Cervicalgia 02/02/2017  . Abdominal pain, epigastric 02/02/2017  . Chronic left-sided low back pain without sciatica 02/02/2017    Nedra Hai PT, DPT, CBIS  Supplemental Physical Therapist [facility]   Pager [phone]   [facility] [address] [place], [place], [address] Phone: [phone]   Fax:  [phone]  Name: Nina Hawkins MRN: [id number] Date of Birth: April 11, 1971

## 2018-03-24 ENCOUNTER — Encounter: Payer: Self-pay | Admitting: Physical Therapy

## 2018-03-24 ENCOUNTER — Ambulatory Visit: Payer: Self-pay | Admitting: Physical Therapy

## 2018-03-24 DIAGNOSIS — M5442 Lumbago with sciatica, left side: Secondary | ICD-10-CM

## 2018-03-24 DIAGNOSIS — R293 Abnormal posture: Secondary | ICD-10-CM

## 2018-03-24 DIAGNOSIS — M542 Cervicalgia: Secondary | ICD-10-CM

## 2018-03-24 DIAGNOSIS — R252 Cramp and spasm: Secondary | ICD-10-CM

## 2018-03-24 DIAGNOSIS — G8929 Other chronic pain: Secondary | ICD-10-CM

## 2018-03-24 NOTE — Therapy (Signed)
[facility] [address] [place], [place], [address] Phone: [phone]   Fax:  [phone]  Physical Therapy Treatment  Patient Details  Name: Nina Hawkins MRN: [id number] Date of Birth: June 09, 1971 Referring Provider: Loletta Specter    Encounter Date: 03/24/2018  PT End of Session - 03/24/18 1545    Visit Number  7    Number of Visits  13    Date for PT Re-Evaluation  04/06/18    Authorization Type  CAFA (01/11/18 through 07/13/18)    Authorization Time Period  02/23/18 to 04/06/18    PT Start Time  0845    PT Stop Time  0927    PT Time Calculation (min)  42 min    Activity Tolerance  Patient tolerated treatment well    Behavior During Therapy  [facility] for tasks assessed/performed       History reviewed. No pertinent past medical history.  Past Surgical History:  Procedure Laterality Date  . ABDOMINAL HYSTERECTOMY      There were no vitals filed for this visit.  Subjective Assessment - 03/24/18 1542    Subjective  Patient reports she has not had much change. She feels pain when she is working. She reports she is doing her exercises at home.     Currently in Pain?  Yes    Pain Score  6     Pain Location  Neck    Pain Orientation  Right;Left    Pain Descriptors / Indicators  Aching    Pain Type  Chronic pain    Pain Radiating Towards  pain down the elbow     Pain Onset  More than a month ago    Aggravating Factors   new job     Pain Relieving Factors  exercises     Effect of Pain on Daily Activities  pain                        [facility] Adult PT Treatment/Exercise - 03/24/18 0001      Neck Exercises: Standing   Other Standing Exercises  scap retraction 2x10 with mofd cuing for technique       Moist Heat Therapy   Number Minutes Moist Heat  10 Minutes    Moist Heat Location  Cervical      Manual Therapy   Manual Therapy  Soft tissue mobilization;Manual Traction    Manual therapy comments  separate  from all other skilled esrvices     Soft tissue mobilization  STM L scalenes, upper trap, levator;    Manual Traction  to cervical spine       Neck Exercises: Stretches   Upper Trapezius Stretch Limitations  2x20 sec hold bilateral     Levator Stretch Limitations  2x20 sec bilateral        Trigger Point Dry Needling - 03/24/18 1540    Upper Trapezius Response  Twitch reponse elicited    Longissimus Response  Twitch response elicited C3 and C6 on the left            PT Education - 03/24/18 1544    Education provided  Yes    Education Details  reviewed benefits and risks of TPDN through interpreter; improtance of posture; reducing post needle soreness     Methods  Explanation;Demonstration;Tactile cues;Verbal cues    Comprehension  Verbalized understanding;Returned demonstration;Verbal cues required;Tactile cues required       PT Short Term Goals - 03/21/18 0950  PT SHORT TERM GOAL #1   Title  Patient to be compliant with correct performance of HEP, to be updated PRN     Baseline  6/10- going well, doing 2x/day     Time  4    Period  Weeks    Status  Achieved      PT SHORT TERM GOAL #2   Title  Patient to report radicular symptoms in L UE only going as far as her elbow in order to show improvement of condition     Baseline  6/10- still going into arm past elbow     Time  3    Period  Weeks    Status  On-going      PT SHORT TERM GOAL #3   Title  Patient to be able to maintain correct upright posture 75% of the time without verbal cues in order to assist in correcting alignment and reducing pain     Baseline  6/10 ongoing postural deficits     Time  3    Period  Weeks    Status  On-going        PT Long Term Goals - 03/24/18 1642      PT LONG TERM GOAL #1   Title  Patient to demonstrate cervical and shoulder ROM as being full and pain free in order to show improved motion and mobility     Baseline  6/10- flowsheets     Time  6    Period  Weeks    Status   On-going      PT LONG TERM GOAL #2   Title  Patient to report pain as being no more than 2/10 at worst in neck and including tasks that involve overhead reaching in order to improve work tolerance and QOL     Baseline  6/10- 4/10 ongoing pain     Time  6    Period  Weeks    Status  On-going      PT LONG TERM GOAL #3   Title  Patient to report resolution of radicular symptoms in order to show improvement of overall condition     Baseline  6/10- ongoing     Time  6    Period  Weeks    Status  On-going      PT LONG TERM GOAL #4   Title  Patient to demonstrate at least a 50% reduction in muscle spasms and trigger points to assist in reducing pain and to show general improvement of condition     Baseline  6/10- ongoing     Time  6    Period  Weeks    Status  On-going            Plan - 03/24/18 1545    Clinical Impression Statement  Patient had a great twitch response with the upper trap and the c4 paraspinal. She reported soreness after. Therapy ggvae her stretches and postural exercises to reduce soreness, Therapy talked to the patient for a while about the improtance of posture. She reports it is hard     Clinical Presentation  Stable    Clinical Decision Making  Low    Clinical Impairments Affecting Rehab Potential  (+) motivated to participate, success with PT in the past; (-) chronicity of pain     PT Frequency  2x / week    PT Duration  Other (comment)    PT Treatment/Interventions  ADLs/Self Care Home Management;Biofeedback;Cryotherapy;Electrical Stimulation;Iontophoresis 4mg /ml Dexamethasone;Moist Heat;Traction;Ultrasound;DME  Instruction;Functional mobility training;Therapeutic activities;Therapeutic exercise;Balance training;Neuromuscular re-education;Patient/family education;Manual techniques;Passive range of motion;Dry needling;Energy conservation;Taping    PT Next Visit Plan  DN, continue to progress as able. Reassess and possible DC on 6/26 if no improvement following DN  sessions     PT Home Exercise Plan  : chin tucks, scap retractions, shoulder rolls .  Use  improved posture with ADL's    Consulted and Agree with Plan of Care  Patient       Patient will benefit from skilled therapeutic intervention in order to improve the following deficits and impairments:  Increased fascial restricitons, Improper body mechanics, Pain, Decreased coordination, Decreased mobility, Increased muscle spasms, Postural dysfunction, Decreased activity tolerance, Decreased strength, Hypomobility, Impaired UE functional use, Impaired flexibility  Visit Diagnosis: Cervicalgia  Abnormal posture  Cramp and spasm  Chronic left-sided low back pain with left-sided sciatica     Problem List Patient Active Problem List   Diagnosis Date Noted  . Facial laceration 12/24/2017  . H. pylori infection 02/03/2017  . Cervicalgia 02/02/2017  . Abdominal pain, epigastric 02/02/2017  . Chronic left-sided low back pain without sciatica 02/02/2017    Dessie Comaavid J Tvisha Schwoerer PT DPT  03/24/2018, 4:45 PM  [facility] [address] [place], [place], [address] Phone: [phone]   Fax:  [phone]  Name: Nina Hawkins MRN: [id number] Date of Birth: [birth year]

## 2018-03-28 ENCOUNTER — Encounter: Payer: Self-pay | Admitting: Physical Therapy

## 2018-03-28 ENCOUNTER — Ambulatory Visit: Payer: Self-pay | Admitting: Physical Therapy

## 2018-03-28 DIAGNOSIS — R252 Cramp and spasm: Secondary | ICD-10-CM

## 2018-03-28 DIAGNOSIS — R293 Abnormal posture: Secondary | ICD-10-CM

## 2018-03-28 DIAGNOSIS — M542 Cervicalgia: Secondary | ICD-10-CM

## 2018-03-28 NOTE — Therapy (Signed)
[facility] [address] [place], [place], [address] Phone: [phone]   Fax:  [phone]  Physical Therapy Treatment  Patient Details  Name: Nina Hawkins MRN: [id number] Date of Birth: February 05, 1971 Referring Provider: Loletta Specter    Encounter Date: 03/28/2018  PT End of Session - 03/28/18 0928    Visit Number  8    Number of Visits  13    Date for PT Re-Evaluation  04/06/18    Authorization Type  CAFA (01/11/18 through 07/13/18)    Authorization Time Period  02/23/18 to 04/06/18    PT Start Time  0845    PT Stop Time  0928    PT Time Calculation (min)  43 min    Activity Tolerance  Patient tolerated treatment well    Behavior During Therapy  [facility] for tasks assessed/performed       History reviewed. No pertinent past medical history.  Past Surgical History:  Procedure Laterality Date  . ABDOMINAL HYSTERECTOMY      There were no vitals filed for this visit.  Subjective Assessment - 03/28/18 0846    Subjective  I had an Ok weekend. I had the needling done last time, I liked the needling, I want them to continue the dry needling. The day they did the needling I hurt and the next day I hurt as well, the pain is less now.     Patient is accompained by:  Interpreter    Patient Stated Goals  get rid of pain     Currently in Pain?  Yes    Pain Score  3     Pain Location  Neck    Pain Orientation  Right;Left    Pain Descriptors / Indicators  Aching                       [facility] Adult PT Treatment/Exercise - 03/28/18 0001      Neck Exercises: Seated   Other Seated Exercise  cervical and thoracic 3D excurisions 1x10 all directions       Neck Exercises: Supine   Neck Retraction  15 reps;3 secs verbal cues for form     Cervical Rotation  Other (comment);15 reps with chin tuck     Other Supine Exercise  thoracic extension stretch x2 minutes over half foam roll; thoracic extension with UE flexion B 1x10         Manual Therapy   Manual Therapy  Soft tissue mobilization;Manual Traction    Manual therapy comments  separate from all other skilled esrvices     Soft tissue mobilization  STM upper traps, levators, scalenes              PT Education - 03/28/18 0928    Education provided  Yes    Education Details  DN moving forward, POC moving forward     Person(s) Educated  Patient    Methods  Explanation    Comprehension  Verbalized understanding       PT Short Term Goals - 03/21/18 0950      PT SHORT TERM GOAL #1   Title  Patient to be compliant with correct performance of HEP, to be updated PRN     Baseline  6/10- going well, doing 2x/day     Time  4    Period  Weeks    Status  Achieved      PT SHORT TERM GOAL #2   Title  Patient to  report radicular symptoms in L UE only going as far as her elbow in order to show improvement of condition     Baseline  6/10- still going into arm past elbow     Time  3    Period  Weeks    Status  On-going      PT SHORT TERM GOAL #3   Title  Patient to be able to maintain correct upright posture 75% of the time without verbal cues in order to assist in correcting alignment and reducing pain     Baseline  6/10 ongoing postural deficits     Time  3    Period  Weeks    Status  On-going        PT Long Term Goals - 03/24/18 1642      PT LONG TERM GOAL #1   Title  Patient to demonstrate cervical and shoulder ROM as being full and pain free in order to show improved motion and mobility     Baseline  6/10- flowsheets     Time  6    Period  Weeks    Status  On-going      PT LONG TERM GOAL #2   Title  Patient to report pain as being no more than 2/10 at worst in neck and including tasks that involve overhead reaching in order to improve work tolerance and QOL     Baseline  6/10- 4/10 ongoing pain     Time  6    Period  Weeks    Status  On-going      PT LONG TERM GOAL #3   Title  Patient to report resolution of radicular symptoms in order to  show improvement of overall condition     Baseline  6/10- ongoing     Time  6    Period  Weeks    Status  On-going      PT LONG TERM GOAL #4   Title  Patient to demonstrate at least a 50% reduction in muscle spasms and trigger points to assist in reducing pain and to show general improvement of condition     Baseline  6/10- ongoing     Time  6    Period  Weeks    Status  On-going            Plan - 03/28/18 [id number]    Clinical Impression Statement  Patient arrives today reporting good outcomes from dry needling sessions, would like to continue with this intervention. Education provided regarding DN session next time as well as general POC moving forward. Otherwise continued with functional exercises and STM this session with introduction of thoracic stretches and mobility drills to assist in reducing neck pain.     Rehab Potential  Good    Clinical Impairments Affecting Rehab Potential  (+) motivated to participate, success with PT in the past; (-) chronicity of pain     PT Frequency  2x / week    PT Duration  Other (comment)    PT Treatment/Interventions  ADLs/Self Care Home Management;Biofeedback;Cryotherapy;Electrical Stimulation;Iontophoresis 4mg /ml Dexamethasone;Moist Heat;Traction;Ultrasound;DME Instruction;Functional mobility training;Therapeutic activities;Therapeutic exercise;Balance training;Neuromuscular re-education;Patient/family education;Manual techniques;Passive range of motion;Dry needling;Energy conservation;Taping    PT Next Visit Plan  continue DN and cervical-thoracic mobility    PT Home Exercise Plan  : chin tucks, scap retractions, shoulder rolls .  Use  improved posture with ADL's    Consulted and Agree with Plan of Care  Patient  Patient will benefit from skilled therapeutic intervention in order to improve the following deficits and impairments:  Increased fascial restricitons, Improper body mechanics, Pain, Decreased coordination, Decreased mobility,  Increased muscle spasms, Postural dysfunction, Decreased activity tolerance, Decreased strength, Hypomobility, Impaired UE functional use, Impaired flexibility  Visit Diagnosis: Cervicalgia  Abnormal posture  Cramp and spasm     Problem List Patient Active Problem List   Diagnosis Date Noted  . Facial laceration 12/24/2017  . H. pylori infection 02/03/2017  . Cervicalgia 02/02/2017  . Abdominal pain, epigastric 02/02/2017  . Chronic left-sided low back pain without sciatica 02/02/2017    Nedra Hai PT, DPT, CBIS  Supplemental Physical Therapist [facility]   Pager [phone]   [facility] [address] [place], [place], [address] Phone: [phone]   Fax:  [phone]  Name: Elizabella Nolet MRN: [id number] Date of Birth: 12-11-1970

## 2018-03-31 ENCOUNTER — Ambulatory Visit: Payer: Self-pay | Admitting: Physical Therapy

## 2018-03-31 ENCOUNTER — Encounter: Payer: Self-pay | Admitting: Physical Therapy

## 2018-03-31 DIAGNOSIS — M62838 Other muscle spasm: Secondary | ICD-10-CM

## 2018-03-31 DIAGNOSIS — R293 Abnormal posture: Secondary | ICD-10-CM

## 2018-03-31 DIAGNOSIS — M542 Cervicalgia: Secondary | ICD-10-CM

## 2018-03-31 DIAGNOSIS — R252 Cramp and spasm: Secondary | ICD-10-CM

## 2018-03-31 DIAGNOSIS — M5442 Lumbago with sciatica, left side: Secondary | ICD-10-CM

## 2018-03-31 DIAGNOSIS — M6281 Muscle weakness (generalized): Secondary | ICD-10-CM

## 2018-03-31 DIAGNOSIS — G8929 Other chronic pain: Secondary | ICD-10-CM

## 2018-03-31 NOTE — Therapy (Signed)
[facility] [address] [place], [place], [address] Phone: [phone]   Fax:  [phone]  Physical Therapy Treatment  Patient Details  Name: Nina Hawkins MRN: [id number] Date of Birth: 05-20-1971 Referring Provider: Loletta Specteroger Pearl Bents Gomez    Encounter Date: 03/31/2018  PT End of Session - 03/31/18 0808    Visit Number  9    Number of Visits  13    Date for PT Re-Evaluation  04/06/18    Authorization Type  CAFA (01/11/18 through 07/13/18)    Authorization Time Period  02/23/18 to 04/06/18    PT Start Time  0802    PT Stop Time  0854    PT Time Calculation (min)  52 min    Activity Tolerance  Patient tolerated treatment well    Behavior During Therapy  [facility] for tasks assessed/performed       History reviewed. No pertinent past medical history.  Past Surgical History:  Procedure Laterality Date  . ABDOMINAL HYSTERECTOMY      There were no vitals filed for this visit.  Subjective Assessment - 03/31/18 0806    Subjective  Patient reports her pain level is about the smae as the pother day. her pain is about a 3/10. The pain is on the left side of the neck.     Pain Score  3     Pain Location  Neck    Pain Orientation  Right;Left    Pain Descriptors / Indicators  Aching    Pain Radiating Towards  Pain is still radiating down her arm     Pain Onset  More than a month ago    Pain Frequency  Constant    Aggravating Factors   New job     Pain Relieving Factors  exercises     Effect of Pain on Daily Activities  pain                        [facility] Adult PT Treatment/Exercise - 03/31/18 0001      Moist Heat Therapy   Number Minutes Moist Heat  10 Minutes    Moist Heat Location  Cervical      Manual Therapy   Manual Therapy  Soft tissue mobilization;Manual Traction    Manual therapy comments  separate from all other skilled esrvices     Soft tissue mobilization  STM L scalenes, upper trap, levator;    Manual  Traction  to cervical spine       Neck Exercises: Stretches   Upper Trapezius Stretch Limitations  2x20 sec hold bilateral     Levator Stretch Limitations  2x20 sec bilateral        Trigger Point Dry Needling - 03/31/18 1300    Consent Given?  Yes    Upper Trapezius Response  Twitch reponse elicited    Longissimus Response  Twitch response elicited           PT Education - 03/31/18 0808    Education provided  Yes    Education Details  reviewed benefits and risks of TPDN     Person(s) Educated  Patient    Methods  Explanation;Demonstration;Tactile cues    Comprehension  Verbalized understanding;Returned demonstration;Verbal cues required;Tactile cues required       PT Short Term Goals - 03/21/18 0950      PT SHORT TERM GOAL #1   Title  Patient to be compliant with correct performance of HEP, to be updated PRN  Baseline  6/10- going well, doing 2x/day     Time  4    Period  Weeks    Status  Achieved      PT SHORT TERM GOAL #2   Title  Patient to report radicular symptoms in L UE only going as far as her elbow in order to show improvement of condition     Baseline  6/10- still going into arm past elbow     Time  3    Period  Weeks    Status  On-going      PT SHORT TERM GOAL #3   Title  Patient to be able to maintain correct upright posture 75% of the time without verbal cues in order to assist in correcting alignment and reducing pain     Baseline  6/10 ongoing postural deficits     Time  3    Period  Weeks    Status  On-going        PT Long Term Goals - 03/24/18 1642      PT LONG TERM GOAL #1   Title  Patient to demonstrate cervical and shoulder ROM as being full and pain free in order to show improved motion and mobility     Baseline  6/10- flowsheets     Time  6    Period  Weeks    Status  On-going      PT LONG TERM GOAL #2   Title  Patient to report pain as being no more than 2/10 at worst in neck and including tasks that involve overhead reaching  in order to improve work tolerance and QOL     Baseline  6/10- 4/10 ongoing pain     Time  6    Period  Weeks    Status  On-going      PT LONG TERM GOAL #3   Title  Patient to report resolution of radicular symptoms in order to show improvement of overall condition     Baseline  6/10- ongoing     Time  6    Period  Weeks    Status  On-going      PT LONG TERM GOAL #4   Title  Patient to demonstrate at least a 50% reduction in muscle spasms and trigger points to assist in reducing pain and to show general improvement of condition     Baseline  6/10- ongoing     Time  6    Period  Weeks    Status  On-going            Plan - 03/31/18 1254    Clinical Impression Statement  Patient had a poor ptolerance to dry needling today. Therapy was onlyable to needle tow spots in her Lower cervical and upper t-spine t-1 and C7. The pain decreased when needling was stopped. Therapy reviewed psotrual exercises and stretching. She was encouraged to continue with the stretching and exercising this afternnon and tomorrow to prevent post needle soreness.     Clinical Presentation  Stable    Clinical Decision Making  Low    Rehab Potential  Good    Clinical Impairments Affecting Rehab Potential  (+) motivated to participate, success with PT in the past; (-) chronicity of pain     PT Frequency  2x / week    PT Duration  Other (comment)    PT Treatment/Interventions  ADLs/Self Care Home Management;Biofeedback;Cryotherapy;Electrical Stimulation;Iontophoresis 4mg /ml Dexamethasone;Moist Heat;Traction;Ultrasound;DME Instruction;Functional mobility training;Therapeutic activities;Therapeutic exercise;Balance training;Neuromuscular re-education;Patient/family  education;Manual techniques;Passive range of motion;Dry needling;Energy conservation;Taping    PT Next Visit Plan  continue DN and cervical-thoracic mobility    PT Home Exercise Plan  : chin tucks, scap retractions, shoulder rolls .  Use  improved posture  with ADL's    Consulted and Agree with Plan of Care  Patient       Patient will benefit from skilled therapeutic intervention in order to improve the following deficits and impairments:  Increased fascial restricitons, Improper body mechanics, Pain, Decreased coordination, Decreased mobility, Increased muscle spasms, Postural dysfunction, Decreased activity tolerance, Decreased strength, Hypomobility, Impaired UE functional use, Impaired flexibility  Visit Diagnosis: Cervicalgia  Abnormal posture  Cramp and spasm  Chronic left-sided low back pain with left-sided sciatica  Other muscle spasm  Muscle weakness (generalized)     Problem List Patient Active Problem List   Diagnosis Date Noted  . Facial laceration 12/24/2017  . H. pylori infection 02/03/2017  . Cervicalgia 02/02/2017  . Abdominal pain, epigastric 02/02/2017  . Chronic left-sided low back pain without sciatica 02/02/2017    Dessie Coma PT DPT  03/31/2018, 1:01 PM  [facility] [address] [place], [place], [address] Phone: [phone]   Fax:  [phone]  Name: Santia Labate MRN: [id number] Date of Birth: 23-Mar-1971

## 2018-04-04 ENCOUNTER — Ambulatory Visit: Payer: Self-pay | Admitting: Physical Therapy

## 2018-04-04 ENCOUNTER — Encounter: Payer: Self-pay | Admitting: Physical Therapy

## 2018-04-04 DIAGNOSIS — R293 Abnormal posture: Secondary | ICD-10-CM

## 2018-04-04 DIAGNOSIS — M542 Cervicalgia: Secondary | ICD-10-CM

## 2018-04-04 DIAGNOSIS — R252 Cramp and spasm: Secondary | ICD-10-CM

## 2018-04-04 NOTE — Therapy (Signed)
[place], [place], [address] Phone: [phone]   Fax:  [phone]  Physical Therapy Treatment (ERO/Recert)  Patient Details  Name: Nina Hawkins MRN: [id number] Date of Birth: [birth year] Referring Provider: Clent Demark    Encounter Date: 04/04/2018  PT End of Session - 04/04/18 1011    Visit Number  10    Number of Visits  12    Date for PT Re-Evaluation  05/02/18    Authorization Type  [employer] (01/11/18 through 07/13/18)    Authorization Time Period  4/49/67 to 5/91/63; recert done 8/46     PT Start Time  0933    PT Stop Time  1011    PT Time Calculation (min)  38 min    Activity Tolerance  Patient tolerated treatment well    Behavior During Therapy  [facility] for tasks assessed/performed       History reviewed. No pertinent past medical history.  Past Surgical History:  Procedure Laterality Date  . ABDOMINAL HYSTERECTOMY      There were no vitals filed for this visit.  Subjective Assessment - 04/04/18 0937    Subjective  I felt better after the needles again. The arm arm was very tender and I did not have any strength in the arm, but after the needles. The low back is bothering me the most right now.     Patient is accompained by:  Interpreter    How long can you sit comfortably?  6/24- unsure, but this has gotten better     How long can you stand comfortably?  6/24- working well     How long can you walk comfortably?  6/24- much better after needles     Patient Stated Goals  get rid of pain     Currently in Pain?  Yes    Pain Score  2     Pain Location  Neck    Pain Orientation  Right;Left    Pain Descriptors / Indicators  Discomfort         [facility] PT Assessment - 04/04/18 0001      Assessment   Medical Diagnosis  neck pain     Referring Provider  Clent Demark     Onset Date/Surgical Date  -- chronic     Hand Dominance  Right    Next MD Visit  none/PRN     Prior Therapy  PT 1  year ago       Precautions   Precautions  None      Restrictions   Weight Bearing Restrictions  No      Balance Screen   Has the patient fallen in the past 6 months  No    Has the patient had a decrease in activity level because of a fear of falling?   No    Is the patient reluctant to leave their home because of a fear of falling?   No      Home Environment   Additional Comments  nothing significant       Prior Function   Level of Independence  Independent;Independent with basic ADLs    Vocation  Full time employment    Leisure  television, gym       AROM   Cervical Flexion  36    Cervical Extension  42    Cervical - Right Side Bend  36    Cervical - Left Side Bend  33    Cervical -  Right Rotation  75    Cervical - Left Rotation  55      Palpation   Palpation comment  improving, continues to have moderate muscle knotting and tension                    [facility] Adult PT Treatment/Exercise - 04/04/18 0001      Neck Exercises: Supine   Cervical Rotation  Other (comment);10 reps 5 second holds for stretch, moist heat     Other Supine Exercise  chin tuck with B UE flexion 1x10       Modalities   Modalities  Moist Heat      Moist Heat Therapy   Number Minutes Moist Heat  5 Minutes during functional exercise     Moist Heat Location  Cervical;Other (comment) during exercise       Manual Therapy   Manual Therapy  Soft tissue mobilization    Manual therapy comments  separate from all other skilled esrvices     Soft tissue mobilization  STM L scalenes, upper trap, levator;             PT Education - 04/04/18 1010    Education provided  Yes    Education Details  progress, progress toward goals, POC moving forward, PT can address LBP/LE pain with new MD order     Person(s) Educated  Patient    Methods  Explanation    Comprehension  Verbalized understanding       PT Short Term Goals - 04/04/18 0946      PT SHORT TERM GOAL #1   Title  Patient to be  compliant with correct performance of HEP, to be updated PRN     Baseline  6/24- compliant     Time  4    Period  Weeks    Status  Achieved      PT SHORT TERM GOAL #2   Title  Patient to report radicular symptoms in L UE only going as far as her elbow in order to show improvement of condition     Baseline  6/24- I no longer have pain in my arm after needling, still have numbness     Time  3    Period  Weeks    Status  Achieved      PT SHORT TERM GOAL #3   Title  Patient to be able to maintain correct upright posture 75% of the time without verbal cues in order to assist in correcting alignment and reducing pain     Baseline  6/24-ongoing     Time  3    Period  Weeks    Status  On-going        PT Long Term Goals - 04/04/18 0948      PT LONG TERM GOAL #1   Title  Patient to demonstrate cervical and shoulder ROM as being full and pain free in order to show improved motion and mobility     Baseline  6/24- improved, flowsheets     Time  6    Period  Weeks    Status  On-going      PT LONG TERM GOAL #2   Title  Patient to report pain as being no more than 2/10 at worst in neck and including tasks that involve overhead reaching in order to improve work tolerance and QOL     Baseline  6/24- 2/10     Time  6    Period  Weeks    Status  Achieved      PT LONG TERM GOAL #3   Title  Patient to report resolution of radicular symptoms in order to show improvement of overall condition     Baseline  6/24- ongoing, still numbness but no pain     Time  6    Period  Weeks    Status  Partially Met      PT LONG TERM GOAL #4   Title  Patient to demonstrate at least a 50% reduction in muscle spasms and trigger points to assist in reducing pain and to show general improvement of condition     Baseline  6/24- ongoing, improving with DN     Time  6    Period  Weeks    Status  Achieved            Plan - 04/04/18 1012    Clinical Impression Statement  Re-assessment performed today to  assess effect of dry needling interventions. Patient has made good progress with dry needling and shows significant improvement in cervical ROM, pain patterns/intensity, and in work tolerance/QOL at this time. Recommend just 2 more dry needling focused sessions to attempt to address remainder of muscle spasms/trigger points and progress pain control before likely DC. Patient is aware that PT can transition to her low back and LE with new MD referral.     Rehab Potential  Good    Clinical Impairments Affecting Rehab Potential  (+) motivated to participate, success with PT in the past; (-) chronicity of pain     PT Frequency  Other (comment) 2 more sessions     PT Duration  Other (comment) 2 more sessions     PT Treatment/Interventions  ADLs/Self Care Home Management;Biofeedback;Cryotherapy;Electrical Stimulation;Iontophoresis 21m/ml Dexamethasone;Moist Heat;Traction;Ultrasound;DME Instruction;Functional mobility training;Therapeutic activities;Therapeutic exercise;Balance training;Neuromuscular re-education;Patient/family education;Manual techniques;Passive range of motion;Dry needling;Energy conservation;Taping    PT Next Visit Plan  2 more DN sessions, then DC     PT Home Exercise Plan  : chin tucks, scap retractions, shoulder rolls .  Use  improved posture with ADL's    Consulted and Agree with Plan of Care  Patient       Patient will benefit from skilled therapeutic intervention in order to improve the following deficits and impairments:  Increased fascial restricitons, Improper body mechanics, Pain, Decreased coordination, Decreased mobility, Increased muscle spasms, Postural dysfunction, Decreased activity tolerance, Decreased strength, Hypomobility, Impaired UE functional use, Impaired flexibility  Visit Diagnosis: Cervicalgia - Plan: PT plan of care cert/re-cert  Abnormal posture - Plan: PT plan of care cert/re-cert  Cramp and spasm - Plan: PT plan of care cert/re-cert     Problem  List Patient Active Problem List   Diagnosis Date Noted  . Facial laceration 12/24/2017  . H. pylori infection 02/03/2017  . Cervicalgia 02/02/2017  . Abdominal pain, epigastric 02/02/2017  . Chronic left-sided low back pain without sciatica 02/02/2017    KDeniece ReePT, DPT, CBIS  Supplemental Physical Therapist [facility]  Pager [address] [address] [address] [place] [address]Phone: [phone]  Fax:  [phone] Name: Nina TerrisMRN: [id number] of Birth: [birth year]

## 2018-04-07 ENCOUNTER — Ambulatory Visit: Payer: Self-pay | Admitting: Physical Therapy

## 2018-04-12 ENCOUNTER — Ambulatory Visit: Payer: Self-pay | Attending: Physician Assistant | Admitting: Physical Therapy

## 2018-04-12 ENCOUNTER — Encounter: Payer: Self-pay | Admitting: Physical Therapy

## 2018-04-12 DIAGNOSIS — R252 Cramp and spasm: Secondary | ICD-10-CM | POA: Insufficient documentation

## 2018-04-12 DIAGNOSIS — M542 Cervicalgia: Secondary | ICD-10-CM | POA: Insufficient documentation

## 2018-04-12 DIAGNOSIS — M62838 Other muscle spasm: Secondary | ICD-10-CM | POA: Insufficient documentation

## 2018-04-12 DIAGNOSIS — M5442 Lumbago with sciatica, left side: Secondary | ICD-10-CM | POA: Insufficient documentation

## 2018-04-12 DIAGNOSIS — R293 Abnormal posture: Secondary | ICD-10-CM | POA: Insufficient documentation

## 2018-04-12 DIAGNOSIS — M6281 Muscle weakness (generalized): Secondary | ICD-10-CM | POA: Insufficient documentation

## 2018-04-12 DIAGNOSIS — G8929 Other chronic pain: Secondary | ICD-10-CM | POA: Insufficient documentation

## 2018-04-12 NOTE — Therapy (Signed)
[place], [place], [address] Phone: [phone]   Fax:  [phone]  Physical Therapy Treatment  Patient Details  Name: Nina Hawkins MRN: [id number] Date of Birth: December 14, 1970 Referring Provider: Clent Demark    Encounter Date: 04/12/2018  PT End of Session - 04/12/18 1100    Visit Number  11    Number of Visits  12    Date for PT Re-Evaluation  05/02/18    Authorization Type  [employer]FA (01/11/18 through 07/13/18)    Authorization Time Period  1/79/15 to 0/56/97; recert done 9/48     PT Start Time  1020    PT Stop Time  1113    PT Time Calculation (min)  53 min    Activity Tolerance  Patient tolerated treatment well    Behavior During Therapy  [facility] for tasks assessed/performed       History reviewed. No pertinent past medical history.  Past Surgical History:  Procedure Laterality Date  . ABDOMINAL HYSTERECTOMY      There were no vitals filed for this visit.  Subjective Assessment - 04/12/18 1023    Subjective  Started hurting again in my neck and I got this appt last minute.  I also have low back pain today too.    Patient is accompained by:  Interpreter using interpreter on i pad    Patient Stated Goals  get rid of pain     Currently in Pain?  Yes    Pain Score  2     Pain Location  Neck    Pain Orientation  Right;Left         [facility] PT Assessment - 04/12/18 0001      AROM   Cervical Flexion  42    Cervical Extension  45    Cervical - Right Side Bend  38    Cervical - Left Side Bend  34    Cervical - Right Rotation  75    Cervical - Left Rotation  60                   [facility] Adult PT Treatment/Exercise - 04/12/18 1027      Self-Care   Self-Care  Other Self-Care Comments    Other Self-Care Comments   TPDN aftercare and precautians      Neck Exercises: Supine   Neck Retraction  15 reps;3 secs verbal cues for form     Cervical Rotation  Other (comment);10 reps 5 second  holds for stretch,     Other Supine Exercise  chin tuck with B UE flexion 1x10       Modalities   Modalities  Moist Heat      Moist Heat Therapy   Number Minutes Moist Heat  12 Minutes    Moist Heat Location  Cervical      Manual Therapy   Manual Therapy  Soft tissue mobilization;Joint mobilization    Manual therapy comments  skilled palpation for TPDN    Joint Mobilization  PA mobs and lateral glide bil  grade 3 C-2 to C-6    Soft tissue mobilization  STM L scalenes, upper trap, levator;    Manual Traction  manual distraction  and sub occipital release      Neck Exercises: Stretches   Upper Trapezius Stretch Limitations  2x30 sec hold bilateral     Levator Stretch Limitations  2x 30sec bilateral        Trigger Point Dry Needling -  04/12/18 1018    Consent Given?  Yes    Education Handout Provided  No previously given    Muscles Treated Upper Body  Upper trapezius;Suboccipitals muscle group;Scalenes;Oblique capitus;Levator scapulae right only    Upper Trapezius Response  Twitch reponse elicited;Palpable increased muscle length    Oblique Capitus Response  Palpable increased muscle length    SubOccipitals Response  Palpable increased muscle length    Levator Scapulae Response  Twitch response elicited;Palpable increased muscle length    Longissimus Response  Palpable increased muscle length right C-2 to c-6           PT Education - 04/12/18 1301    Education provided  Yes    Education Details  reveiwed HEP partially, Pt c/o back pain and told to address with a new MD order and to finish neck rehab first reviewed TPDN precautians and aftercare    Person(s) Educated  Patient    Methods  Explanation;Demonstration    Comprehension  Verbalized understanding;Returned demonstration       PT Short Term Goals - 04/04/18 0946      PT SHORT TERM GOAL #1   Title  Patient to be compliant with correct performance of HEP, to be updated PRN     Baseline  6/24- compliant     Time   4    Period  Weeks    Status  Achieved      PT SHORT TERM GOAL #2   Title  Patient to report radicular symptoms in L UE only going as far as her elbow in order to show improvement of condition     Baseline  6/24- I no longer have pain in my arm after needling, still have numbness     Time  3    Period  Weeks    Status  Achieved      PT SHORT TERM GOAL #3   Title  Patient to be able to maintain correct upright posture 75% of the time without verbal cues in order to assist in correcting alignment and reducing pain     Baseline  6/24-ongoing     Time  3    Period  Weeks    Status  On-going        PT Long Term Goals - 04/12/18 1101      PT LONG TERM GOAL #1   Title  Patient to demonstrate cervical and shoulder ROM as being full and pain free in order to show improved motion and mobility     Baseline  imporved AROM cervical but pain at 2/10 see flowsheet    Time  6    Period  Weeks    Status  Partially Met      PT LONG TERM GOAL #2   Title  Patient to report pain as being no more than 2/10 at worst in neck and including tasks that involve overhead reaching in order to improve work tolerance and QOL     Baseline  7/2 - 2/10 pain    Time  6    Period  Weeks    Status  Achieved      PT LONG TERM GOAL #3   Title  Patient to report resolution of radicular symptoms in order to show improvement of overall condition     Baseline  7-2 ongoing C-5/6 radiculopathy    Time  6    Period  Weeks    Status  Partially Met      PT LONG  TERM GOAL #4   Title  Patient to demonstrate at least a 50% reduction in muscle spasms and trigger points to assist in reducing pain and to show general improvement of condition     Baseline  7-2  pt with moderate spasming today on right cervical and scalenes pain 2/10    Time  6    Period  Weeks    Status  Achieved            Plan - 04/12/18 1308    Clinical Impression Statement  Pt returns to clinic with2/10 neck pain and complaint of spasm.  Pt  was assisted with interpreter by Ronalee Red Florentina Jenny).  Pt reviewed some exercises and consented to TPDN which was her primary motivation today to achieve.  Pt consented and was closely monitored with help of video interpreter. Pt has increase inCervical AROM  ( see flowsheet). Pt will benefit from TPDN for 1-2 sessions and review of HEP,before DC.  Pt also complained of back pain and was told she would need to be evaluatated by MD and receive a new referral.  Her job is to complete cervical rehab first .      Clinical Impairments Affecting Rehab Potential  (+) motivated to participate, success with PT in the past; (-) chronicity of pain     PT Frequency  Other (comment) 1 more session    PT Duration  Other (comment) 1 more session after today    PT Treatment/Interventions  ADLs/Self Care Home Management;Biofeedback;Cryotherapy;Electrical Stimulation;Iontophoresis 65m/ml Dexamethasone;Moist Heat;Traction;Ultrasound;DME Instruction;Functional mobility training;Therapeutic activities;Therapeutic exercise;Balance training;Neuromuscular re-education;Patient/family education;Manual techniques;Passive range of motion;Dry needling;Energy conservation;Taping    PT Next Visit Plan  1 more session and then DC assess goals    PT Home Exercise Plan  : chin tucks, scap retractions, shoulder rolls .  Use  improved posture with ADL's    Consulted and Agree with Plan of Care  Patient       Patient will benefit from skilled therapeutic intervention in order to improve the following deficits and impairments:  Increased fascial restricitons, Improper body mechanics, Pain, Decreased coordination, Decreased mobility, Increased muscle spasms, Postural dysfunction, Decreased activity tolerance, Decreased strength, Hypomobility, Impaired UE functional use, Impaired flexibility  Visit Diagnosis: Cervicalgia  Abnormal posture  Cramp and spasm  Chronic left-sided low back pain with left-sided sciatica  Other muscle  spasm  Muscle weakness (generalized)     Problem List Patient Active Problem List   Diagnosis Date Noted  . Facial laceration 12/24/2017  . H. pylori infection 02/03/2017  . Cervicalgia 02/02/2017  . Abdominal pain, epigastric 02/02/2017  . Chronic left-sided low back pain without sciatica 02/02/2017    LVoncille Lo PT Certified Exercise Expert for the Aging Adult  04/12/18 1:23 PM Phone: [phone]: 3[facility] [place] [address]Phone: [phone]  Fax:  [phone] Name: AKazia GrisantiMRN: [id number] of Birth: 1Jan 19, 1972

## 2018-04-15 ENCOUNTER — Encounter

## 2018-04-21 ENCOUNTER — Ambulatory Visit: Payer: Self-pay | Admitting: Physical Therapy

## 2018-04-21 DIAGNOSIS — M6281 Muscle weakness (generalized): Secondary | ICD-10-CM

## 2018-04-21 DIAGNOSIS — R252 Cramp and spasm: Secondary | ICD-10-CM

## 2018-04-21 DIAGNOSIS — M5442 Lumbago with sciatica, left side: Secondary | ICD-10-CM

## 2018-04-21 DIAGNOSIS — G8929 Other chronic pain: Secondary | ICD-10-CM

## 2018-04-21 DIAGNOSIS — M542 Cervicalgia: Secondary | ICD-10-CM

## 2018-04-21 DIAGNOSIS — M62838 Other muscle spasm: Secondary | ICD-10-CM

## 2018-04-21 DIAGNOSIS — R293 Abnormal posture: Secondary | ICD-10-CM

## 2018-04-21 NOTE — Therapy (Signed)
[place], [place], [address] Phone: [phone]   Fax:  [phone]  Physical Therapy Treatment/ Discharge   Patient Details  Name: Nina Hawkins MRN: [id number] Date of Birth: May 30, 1971 Referring Provider: Clent Demark    Encounter Date: 04/21/2018  PT End of Session - 04/21/18 2043    Visit Number  12    Number of Visits  12    Date for PT Re-Evaluation  05/02/18    Authorization Type  CAFA (01/11/18 through 07/13/18)    Authorization Time Period  02/04/82 to 01/28/61; recert done 2/29     PT Start Time  1100    PT Stop Time  1142    PT Time Calculation (min)  42 min    Activity Tolerance  Patient tolerated treatment well    Behavior During Therapy  [facility] for tasks assessed/performed       No past medical history on file.  Past Surgical History:  Procedure Laterality Date  . ABDOMINAL HYSTERECTOMY      There were no vitals filed for this visit.  Subjective Assessment - 04/21/18 1110    Subjective  Patient reports her pain is a little better but at this point it is about the same. She continues to have pain down her left arm. She has been working on her exercises at home.     How long can you sit comfortably?  6/24- unsure, but this has gotten better     Pain Score  3     Pain Orientation  Left;Right    Pain Descriptors / Indicators  Aching    Pain Type  Chronic pain    Pain Onset  More than a month ago    Pain Frequency  Constant    Aggravating Factors   newjob     Pain Relieving Factors  exercises     Effect of Pain on Daily Activities  pain          [facility] PT Assessment - 04/21/18 0001      AROM   Cervical Flexion  40  (Pended)     Cervical Extension  43  (Pended)     Cervical - Right Rotation  60  (Pended)     Cervical - Left Rotation  55  (Pended)  pain turning to the left       Strength   Right/Left Shoulder  Right;Left  (Pended)     Right Shoulder Flexion  4+/5  (Pended)      Right Shoulder Internal Rotation  5/5  (Pended)     Right Shoulder External Rotation  5/5  (Pended)     Left Shoulder Flexion  5/5  (Pended)     Left Shoulder Internal Rotation  5/5  (Pended)     Left Shoulder External Rotation  5/5  (Pended)                    [facility] Adult PT Treatment/Exercise - 04/21/18 0001      Self-Care   Other Self-Care Comments   reviewed the improtance of continued strengthening; reviewed the improtance of posture.       Manual Therapy   Manual Therapy  Soft tissue mobilization;Joint mobilization    Manual therapy comments  skilled palpation for TPDN    Joint Mobilization  PA mobs and lateral glide bil  grade 3 C-2 to C-6    Soft tissue mobilization  STM L scalenes, upper trap, levator;  Manual Traction  manual distraction  and sub occipital release             PT Education - 04/21/18 2042    Education provided  Yes    Education Details  reviewed final HEP     Person(s) Educated  Patient    Methods  Demonstration;Explanation;Verbal cues;Tactile cues    Comprehension  Returned demonstration;Verbal cues required;Verbalized understanding       PT Short Term Goals - 04/21/18 2049      PT SHORT TERM GOAL #1   Title  Patient to be compliant with correct performance of HEP, to be updated PRN     Baseline  6/24- compliant     Time  4    Period  Weeks    Status  Achieved      PT SHORT TERM GOAL #2   Title  Patient to report radicular symptoms in L UE only going as far as her elbow in order to show improvement of condition     Baseline  6/24- I no longer have pain in my arm after needling, still have numbness     Time  3    Period  Weeks    Status  Partially Met      PT SHORT TERM GOAL #3   Title  Patient to be able to maintain correct upright posture 75% of the time without verbal cues in order to assist in correcting alignment and reducing pain     Baseline  6/24-ongoing     Time  3    Period  Weeks    Status  On-going       PT SHORT TERM GOAL #4   Title  Patient will be independent with inital HEP.     Baseline  reports compliance     Time  4    Period  Weeks    Status  Achieved        PT Long Term Goals - 04/21/18 2050      PT LONG TERM GOAL #1   Title  Patient to demonstrate cervical and shoulder ROM as being full and pain free in order to show improved motion and mobility     Baseline  imporved AROM cervical but pain at 2/10 see flowsheet    Time  6    Period  Weeks    Status  Partially Met      PT LONG TERM GOAL #2   Title  Patient to report pain as being no more than 2/10 at worst in neck and including tasks that involve overhead reaching in order to improve work tolerance and QOL     Baseline  7/2 - 2/10 pain    Time  6    Period  Weeks    Status  Achieved      PT LONG TERM GOAL #3   Title  Patient to report resolution of radicular symptoms in order to show improvement of overall condition     Baseline  7-2 ongoing C-5/6 radiculopathy    Time  6    Period  Weeks    Status  Achieved      PT LONG TERM GOAL #4   Title  Patient to demonstrate at least a 50% reduction in muscle spasms and trigger points to assist in reducing pain and to show general improvement of condition     Baseline  7-2  pt with moderate spasming today on right cervical and scalenes pain 2/10  Time  6    Period  Weeks    Status  Achieved            Plan - 04/21/18 2044    Clinical Impression Statement  Patient has reached max potential for PT. She continues to have radicualr pain down her left arm and pain in her neck. Her pain has improved. Her neck range has improved slighlty. Her posture has not really improved. Until her posture improves and she modifies her pwrok task it is unlikley her pain is going to change significantly. She reports she is doingher exercises at home. She was encouraged to continue with her exercises at home. She reports her back is hurting her as well. She may benefit from therapy for her  back but if her posture and her work tasks stay the same her prognosis is likley similar to her neck. Patient had a slight improvement in her FOTO score. D/C to HEP.     Clinical Presentation  Stable    Clinical Decision Making  Low    Rehab Potential  Good    Clinical Impairments Affecting Rehab Potential  (+) motivated to participate, success with PT in the past; (-) chronicity of pain     PT Frequency  Other (comment)    PT Duration  Other (comment)    PT Treatment/Interventions  ADLs/Self Care Home Management;Biofeedback;Cryotherapy;Electrical Stimulation;Iontophoresis 46m/ml Dexamethasone;Moist Heat;Traction;Ultrasound;DME Instruction;Functional mobility training;Therapeutic activities;Therapeutic exercise;Balance training;Neuromuscular re-education;Patient/family education;Manual techniques;Passive range of motion;Dry needling;Energy conservation;Taping    PT Next Visit Plan  D/C     PT Home Exercise Plan  : chin tucks, scap retractions, shoulder rolls .  Use  improved posture with ADL's       Patient will benefit from skilled therapeutic intervention in order to improve the following deficits and impairments:  Increased fascial restricitons, Improper body mechanics, Pain, Decreased coordination, Decreased mobility, Increased muscle spasms, Postural dysfunction, Decreased activity tolerance, Decreased strength, Hypomobility, Impaired UE functional use, Impaired flexibility  Visit Diagnosis: Cervicalgia  Abnormal posture  Cramp and spasm  Chronic left-sided low back pain with left-sided sciatica  Other muscle spasm  Muscle weakness (generalized)  PHYSICAL THERAPY DISCHARGE SUMMARY  Visits from Start of Care: 12  Current functional level related to goals / functional outcomes: Slight improvement in neck motion    Remaining deficits: Continued pain and radicular symptoms into the arm    Education / Equipment: HEP  Plan: Patient agrees to discharge.  Patient goals were  partially met. Patient is being discharged due to lack of progress.  ?????       Problem List Patient Active Problem List   Diagnosis Date Noted  . Facial laceration 12/24/2017  . H. pylori infection 02/03/2017  . Cervicalgia 02/02/2017  . Abdominal pain, epigastric 02/02/2017  . Chronic left-sided low back pain without sciatica 02/02/2017    DCarney LivingPT DPT  04/21/2018, 8:55 PM  [facility] [place] [address]Phone: [phone]  Fax:  [phone] Name: AKeta VanvalkenburghMRN: [id number] of Birth: 1Feb 15, 1972

## 2018-04-28 ENCOUNTER — Encounter: Payer: Self-pay | Admitting: Physical Therapy

## 2018-05-19 ENCOUNTER — Encounter (INDEPENDENT_AMBULATORY_CARE_PROVIDER_SITE_OTHER): Payer: Self-pay | Admitting: Physician Assistant

## 2018-05-19 ENCOUNTER — Ambulatory Visit (INDEPENDENT_AMBULATORY_CARE_PROVIDER_SITE_OTHER): Payer: Self-pay | Admitting: Physician Assistant

## 2018-05-19 ENCOUNTER — Other Ambulatory Visit: Payer: Self-pay

## 2018-05-19 VITALS — BP 144/88 | HR 76 | Temp 97.8°F | Ht <= 58 in | Wt 177.2 lb

## 2018-05-19 DIAGNOSIS — M5412 Radiculopathy, cervical region: Secondary | ICD-10-CM

## 2018-05-19 DIAGNOSIS — R7303 Prediabetes: Secondary | ICD-10-CM

## 2018-05-19 DIAGNOSIS — R030 Elevated blood-pressure reading, without diagnosis of hypertension: Secondary | ICD-10-CM

## 2018-05-19 LAB — POCT GLYCOSYLATED HEMOGLOBIN (HGB A1C): Hemoglobin A1C: 5.7 % — AB (ref 4.0–5.6)

## 2018-05-19 MED ORDER — PREGABALIN 50 MG PO CAPS: 50.0000 mg | ORAL_CAPSULE | Freq: Two times a day (BID) | ORAL | 5 refills | Status: DC

## 2018-05-19 NOTE — Progress Notes (Signed)
Subjective:  Patient ID: Nina Hawkins, female    DOB: [birth year]  Age: 47 y.o. MRN: [id number]  CC: neck pain and back pain  HPI Cathyann Martinezis a 47 y.o.femalewith a PMH ofprediabetes,chronic left sided LBP and left sided cervicalgia presents with chronic neck pain and back pain. Was sent to physical therapy three months ago and completed 12 sessions. Physical therapist feels pt has reached max potential for PT. States her posture is likely contributing to her pain and pain is unlikely to change significantly without change in posture. Physical therapist also states the same prognosis and advise for her back pain. Pt was released to HEP. Pt has failed several medications and failed several c spine injections. [facility] advised for her to return if radiculopathy continues. Pt reluctant to return to Dr. Ophelia CharterYates because "he didn't do anything for me".      Last A1c 6.0% six months ago. A1c 5.7% today. Not following any particular diet and exercise consists only of HEP for cervicalgia.      Outpatient Medications Prior to Visit  Medication Sig Dispense Refill  . cyclobenzaprine (FLEXERIL) 5 MG tablet Take 1 tablet (5 mg total) by mouth 3 (three) times daily as needed for muscle spasms. (Patient not taking: Reported on 05/19/2018) 60 tablet 1  . ibuprofen (ADVIL,MOTRIN) 600 MG tablet Take 1 tablet (600 mg total) by mouth every 8 (eight) hours as needed. (Patient not taking: Reported on 05/19/2018) 60 tablet 1  . naproxen (NAPROSYN) 500 MG tablet Take 1 tablet (500 mg total) by mouth 2 (two) times daily with a meal. (Patient not taking: Reported on 05/19/2018) 30 tablet 1  . Probiotic Product (RESTORA) CAPS Take 1 capsule by mouth daily. (Patient not taking: Reported on 01/26/2018) 30 capsule 1  . senna-docusate (SENOKOT-S) 8.6-50 MG tablet Take 1 tablet by mouth daily. (Patient not taking: Reported on 05/19/2018) 30 tablet 1  . sertraline (ZOLOFT) 25 MG tablet Take 1 tablet (25 mg  total) by mouth daily. (Patient not taking: Reported on 05/19/2018) 30 tablet 5   No facility-administered medications prior to visit.      ROS Review of Systems  Constitutional: Negative for chills, fever and malaise/fatigue.  Eyes: Negative for blurred vision.  Respiratory: Negative for shortness of breath.   Cardiovascular: Negative for chest pain and palpitations.  Gastrointestinal: Negative for abdominal pain and nausea.  Genitourinary: Negative for dysuria and hematuria.  Musculoskeletal: Positive for back pain and neck pain. Negative for joint pain and myalgias.  Skin: Negative for rash.  Neurological: Positive for tingling (LUE). Negative for headaches.  Psychiatric/Behavioral: Negative for depression. The patient is not nervous/anxious.     Objective:  BP (!) 144/88 (BP Location: Left Arm, Patient Position: Sitting, Cuff Size: Large)   Pulse 76   Temp 97.8 F (36.6 C) (Oral)   Ht 4' 9.5" (1.461 m)   Wt 177 lb 3.2 oz (80.4 kg)   SpO2 98%   BMI 37.68 kg/m   BP/Weight 05/19/2018 02/15/2018 01/26/2018  Systolic BP 144 122 140  Diastolic BP 88 83 91  Wt. (Lbs) 177.2 183 172.6  BMI 37.68 36.96 37.35      Physical Exam  Constitutional: She is oriented to person, place, and time.  Well developed, well nourished, NAD, polite  HENT:  Head: Normocephalic and atraumatic.  Eyes: No scleral icterus.  Neck: Normal range of motion. Neck supple. No thyromegaly present.  Cardiovascular: Normal rate, regular rhythm and normal heart sounds.  Pulmonary/Chest: Effort  normal and breath sounds normal.  Musculoskeletal: She exhibits no edema.  Left sided TTP without sign of edema, erythema, or ecchymosis. Full aROM of C-spine with pain elicited on left rotation and left lateral flexion.  Neurological: She is alert and oriented to person, place, and time. No cranial nerve deficit. Coordination normal.  Strength 5/5 of the LUE and RUE  Skin: Skin is warm and dry. No rash noted. No  erythema. No pallor.  Psychiatric: She has a normal mood and affect. Her behavior is normal. Thought content normal.  Vitals reviewed.    Assessment & Plan:    1. Cervical radiculopathy - Advised to return to Dr. Ophelia Charter or another doctor at [facility]. She had been told to return if radiculopathy persists. Pt advised to continue HEP as indicated by physical therapy.  - Begin Lyrica 50 mg one tablet BID.   2. Prediabetes - HgB A1c 5.7% today. Down from 6.0% six months ago. Not currently taking antiglycemic.  3. Elevated blood pressure reading without diagnosis of hypertension - likely attributed to pain    Meds ordered this encounter  Medications  . pregabalin (LYRICA) 50 MG capsule    Sig: Take 1 capsule (50 mg total) by mouth 2 (two) times daily.    Dispense:  60 capsule    Refill:  5    Order Specific Question:   Supervising Provider    Answer:   Hoy Register [[id number]]    Follow-up: Return in about 4 weeks (around 06/16/2018) for cervicalgia.   Loletta Specter PA

## 2018-05-19 NOTE — Patient Instructions (Signed)
Pregabalin capsules Qu es este medicamento? La PREGABALINA se [place] Islandsutiliza para tratar Starwood Hotelsel dolor de los nervios provocado por diabetes, culebrilla, dao a la mdula espinal y fibromialgia. Se utiliza tambin para controlar las convulsiones de la epilepsia. Este medicamento puede ser utilizado para otros usos; si tiene alguna pregunta consulte con su proveedor de atencin mdica o con su farmacutico. MARCAS COMUNES: Lyrica Qu le debo informar a mi profesional de la salud antes de tomar este medicamento? Necesita saber si usted presenta alguno de los siguientes problemas o situaciones: -problemas sanguneos -enfermedad cardiaca, incluyendo insuficiencia cardiaca -antecedentes de abuso de drogas o drogadiccin -enfermedad renal -ideas suicidas, planes o intentos, si usted o alguien de su familia ha intentado un suicidio previo -una reaccin alrgica o inusual a la pregabalina, gabapentina, otros medicamentos, alimentos, colorantes o conservantes -si est embarazada o buscando quedar embarazada o si tratando concebir con su pareja -si est amamantando a un beb Cmo debo utilizar este medicamento? Tome este medicamento por va oral con un vaso de agua. Siga las instrucciones de la etiqueta del Gustinemedicamento. Puede tomar este medicamento con o sin alimentos. Tome sus dosis a intervalos regulares. No tome su medicamento con una frecuencia mayor a la indicada. No deje de tomarlo, excepto si as lo indica su mdico. Su farmacutico le dar una Gua del medicamento especial (MedGuide, nombre en ingls) con cada receta y en cada ocasin que la vuelva a surtir. Asegrese de leer esta informacin cada vez cuidadosamente. Hable con su pediatra para informarse acerca del uso de este medicamento en nios. Puede requerir atencin especial. Sobredosis: Pngase en contacto inmediatamente con un centro toxicolgico o una sala de urgencia si usted cree que haya tomado demasiado medicamento. ATENCIN: Reynolds AmericanEste medicamento  es solo para usted. No comparta este medicamento con nadie. Qu sucede si me olvido de una dosis? Si olvida una dosis, tmela lo antes posible. Si es casi la hora de la prxima dosis, tome slo esa dosis. No tome dosis adicionales o dobles. Qu puede interactuar con este medicamento? -alcohol -ciertos medicamentos para la presin sangunea, tales como captopril, enalapril o lisinopril -ciertos medicamentos para la diabetes, tales como pioglitazona o rosiglitazona -ciertos medicamentos para la ansiedad o para dormir -medicamentos narcticos para Chief Technology Officerel dolor Puede ser que esta lista no menciona todas las posibles interacciones. Informe a su profesional de Beazer Homesla salud de Ingram Micro Inctodos los productos a base de hierbas, medicamentos de Mathisventa libre o suplementos nutritivos que est tomando. Si usted fuma, consume bebidas alcohlicas o si utiliza drogas ilegales, indqueselo tambin a su profesional de Beazer Homesla salud. Algunas sustancias pueden interactuar con su medicamento. A qu debo estar atento al usar PPL Corporationeste medicamento? Informe a su mdico o su profesional de la salud si sus sntomas no comienzan a mejorar o si empeoran. Visite a su mdico o a su profesional de la salud para chequear su evolucin peridicamente. No deje de tomar este medicamento excepto si as lo indica su mdico. Usted puede desarrollar una reaccin grave. Su mdico le indicar la cantidad de Agricultural consultantmedicamento que necesitar tomar. Use una pulsera o cadena de identificacin mdica. Lleve consigo una tarjeta de identificacin con informacin sobre su enfermedad y Engineer, manufacturing systemsdetalles de sus medicamentos y los horarios de las dosis. Puede experimentar mareos o somnolencia. No conduzca ni utilice maquinaria ni haga nada que Scientist, research (life sciences)le exija permanecer en estado de alerta hasta que sepa cmo le afecta este medicamento. No se siente ni se ponga de pie con rapidez, especialmente si es un paciente de edad avanzada. Esto reduce el  riesgo de Dean Foods Company. El alcohol puede interferir  con el efecto de [place]. Evite consumir bebidas alcohlicas. Si tiene Administrator, arts, como insuficiencia cardaca [place], y nota que est reteniendo agua y tiene hinchazn de las manos o pies, consulte a su proveedor de atencin mdica inmediatamente. El uso de este medicamento puede aumentar la posibilidad de ideas o comportamiento suicida. Presta atencin a como usted responde mientras est usando este medicamento. Cualquier empeoramiento de humor o ideas de suicidio o de morir que [place], debe informar a su profesional de la salud inmediatamente. Este medicamento ha causado una reduccin de esperma en algunos hombres. Esto puede interferir con la capacidad de tener un nio. Usted debe hablar con su mdico su profesional de la salud si est preocupado sobre su fertilidad. Las mujeres que se encuentran Database administrator usan este medicamento para convulsiones pueden inscribirse en el registro del [place] Antiepileptic Drug Pregnancy Registry (Registro estadounidense de Psychiatrist de Medicamentos Antiepilpticos) llamando al telfono [phone]. Este registro recoge informacin acerca de la seguridad del uso de medicamentos antiepilpticos durante el Psychiatrist. Qu efectos secundarios puedo tener al Boston Scientific este medicamento? Efectos secundarios que debe informar a su mdico o a Producer, television/film/video de la salud tan pronto como sea posible: Therapist, art, como erupcin cutnea, picazn o urticarias, e hinchazn de la cara, los labios o la lengua problemas respiratorios cambios en la visin dolor en el pecho confusin sacudidas o movimientos inusuales de cualquier parte del cuerpo prdida de Engineer, maintenance, sensibilidad o debilidad muscular ideas suicidas u otros cambios en el estado de nimo hinchazn de tobillos, pies, manos sangrado o moretones inusuales Efectos secundarios que generalmente no requieren atencin mdica (infrmelos a su mdico o a Producer, television/film/video de la salud  si persisten o si son molestos): mareos somnolencia boca seca dolor de cabeza nuseas temblores dificultad para conciliar el sueo aumento de peso Puede ser que esta lista no menciona todos los posibles efectos secundarios. Comunquese a su mdico por asesoramiento mdico Hewlett-Packard. Usted puede informar los efectos secundarios a la FDA por telfono al 1-800-FDA-1088. Dnde debo guardar mi medicina? Mantenga fuera del alcance de los nios. Existe la posibilidad de abusar de PPL Corporation. Mantenga su medicamento en un lugar seguro para protegerlo contra robos. No comparta este medicamento con nadie. Es peligroso vender o Restaurant manager, fast food, y est prohibido por la ley. Este medicamento puede causar Neomia Dear sobredosis accidental y la muerte si lo toman otros adultos, nios o Neurosurgeon. Mezcle todo el medicamento sin usar con una sustancia como piedras sanitarias para gatos o posos (residuos) de caf. Luego deseche el Science Applications International un recipiente cerrado, como una bolsa sellada o una lata de caf con tapa. No utilice este medicamento despus de la fecha de vencimiento. Guarde a Sanmina-SCI, entre 15 y 30 grados Celsius (59 y 34 grados Fahrenheit). ATENCIN: Este folleto es un resumen. Puede ser que no cubra toda la posible informacin. Si usted tiene preguntas acerca de esta medicina, consulte con su mdico, su farmacutico o su profesional de Radiographer, therapeutic.  2018 Elsevier/Gold Standard (2016-10-29 00:00:00)

## 2018-05-27 ENCOUNTER — Other Ambulatory Visit (INDEPENDENT_AMBULATORY_CARE_PROVIDER_SITE_OTHER): Payer: Self-pay | Admitting: Physician Assistant

## 2018-05-27 ENCOUNTER — Telehealth (INDEPENDENT_AMBULATORY_CARE_PROVIDER_SITE_OTHER): Payer: Self-pay | Admitting: Physician Assistant

## 2018-05-27 MED ORDER — GABAPENTIN 100 MG PO CAPS: 100.0000 mg | ORAL_CAPSULE | Freq: Three times a day (TID) | ORAL | 3 refills | Status: DC

## 2018-05-27 MED FILL — GABAPENTIN 100 MG CAPSULE: 100 | 30 days supply | Qty: 90 | Fill #0

## 2018-05-27 NOTE — Telephone Encounter (Signed)
Patient called to inform PCP that she went to [facility] to pick up her   pregabalin (LYRICA) 50 MG capsule   and was told that they could order the medication and would have it in 3 days and could later enroll in a program to get it for free but could take up to 3 months. Patient states she was advice by the pharmacist to take the RX to a different pharmacy. [facility] went to [facility] and was told it would cost $500 out of pocket.  Please Advice [phone]  Thank you Louisa SecondGloria I Vargas Hernandez

## 2018-05-27 NOTE — Telephone Encounter (Signed)
Nina Hawkins,  Would you please call and notify patient that Pregabalin has been changed to gabapentin and [facility] Thank you. Maryjean Mornempestt S Roberts, CMA

## 2018-05-27 NOTE — Telephone Encounter (Signed)
Called patient to inform her about her new RX. Patient did not answer left voice mail to call back if she had any concerns.  Thank you Louisa SecondGloria I Vargas Hernandez

## 2018-05-27 NOTE — Telephone Encounter (Signed)
Noted. Tempestt S Roberts, CMA  

## 2018-05-27 NOTE — Telephone Encounter (Signed)
Spoke with [facility] pharmacy. Pregabalin was previously covered under the PASS program. A generic of Pregabalin has been released so it is no longer covered un PASS. The only alternative through [facility] pharmacy would be to prescribe the patient gabapentin. Maryjean Mornempestt S Tate Jerkins, CMA

## 2018-05-27 NOTE — Telephone Encounter (Signed)
I have sent gabapentin to [facility]. Please notify patient to go pick up.

## 2018-06-22 ENCOUNTER — Encounter (INDEPENDENT_AMBULATORY_CARE_PROVIDER_SITE_OTHER): Payer: Self-pay | Admitting: Physician Assistant

## 2018-06-22 ENCOUNTER — Other Ambulatory Visit: Payer: Self-pay

## 2018-06-22 ENCOUNTER — Ambulatory Visit (INDEPENDENT_AMBULATORY_CARE_PROVIDER_SITE_OTHER): Payer: Self-pay | Admitting: Physician Assistant

## 2018-06-22 VITALS — BP 126/87 | HR 58 | Temp 97.9°F | Ht <= 58 in | Wt 173.6 lb

## 2018-06-22 DIAGNOSIS — E7841 Elevated Lipoprotein(a): Secondary | ICD-10-CM

## 2018-06-22 DIAGNOSIS — H9311 Tinnitus, right ear: Secondary | ICD-10-CM

## 2018-06-22 DIAGNOSIS — E876 Hypokalemia: Secondary | ICD-10-CM

## 2018-06-22 DIAGNOSIS — Z23 Encounter for immunization: Secondary | ICD-10-CM

## 2018-06-22 DIAGNOSIS — M542 Cervicalgia: Secondary | ICD-10-CM

## 2018-06-22 MED ORDER — GABAPENTIN 100 MG PO CAPS: ORAL_CAPSULE | ORAL | 5 refills | Status: DC

## 2018-06-22 NOTE — Progress Notes (Signed)
Subjective:  Patient ID: Nina Hawkins, female    DOB: 07-Nov-1970  Age: 47 y.o. MRN: [id number]  CC: f/u cervicalgia  HPI Nina Hawkins a 47 y.o.femalewith a PMH ofprediabetes,chronic left sided LBP and left sided cervicalgia presentsto f/u on cervicalgia. Went to physical therapy with max potential reached for PT. Pt doing HEP. Pt has failed several medications and failed several C spine injections. Was supposed to f/u with Dr. Ophelia Charter at orthopedics but patient did not feel the need to go to ortho because gabapentin 100 mg TID has been effective at reducing her pain. Reports a 60% reduction of pain and no longer feels the swelling/tightness of the left side of the neck.     Pt also complains of a sound in the right ear that is most noticeable in a quiet room. Denies dizziness, loss of audition, headache, nasal congestion, f/c/n/v, malaise, weakness, CP, palpitations, SOB, swelling, rash, or GI/GU sxs.    Outpatient Medications Prior to Visit  Medication Sig Dispense Refill  . gabapentin (NEURONTIN) 100 MG capsule Take 1 capsule (100 mg total) by mouth 3 (three) times daily. 90 capsule 3   No facility-administered medications prior to visit.      ROS Review of Systems  Constitutional: Negative for chills, fever and malaise/fatigue.  HENT: Positive for tinnitus. Negative for congestion, ear discharge, ear pain and sinus pain.   Eyes: Negative for blurred vision.  Respiratory: Negative for shortness of breath.   Cardiovascular: Negative for chest pain and palpitations.  Gastrointestinal: Negative for abdominal pain and nausea.  Genitourinary: Negative for dysuria and hematuria.  Musculoskeletal: Positive for neck pain. Negative for joint pain and myalgias.  Skin: Negative for rash.  Neurological: Negative for tingling and headaches.  Psychiatric/Behavioral: Negative for depression. The patient is not nervous/anxious.     Objective:  BP 126/87 (BP Location: Right Arm, Patient  Position: Sitting, Cuff Size: Large)   Pulse (!) 58   Temp 97.9 F (36.6 C) (Oral)   Ht 4' 9.5" (1.461 m)   Wt 173 lb 9.6 oz (78.7 kg)   SpO2 97%   BMI 36.92 kg/m   BP/Weight 06/22/2018 05/19/2018 02/15/2018  Systolic BP 126 144 122  Diastolic BP 87 88 83  Wt. (Lbs) 173.6 177.2 183  BMI 36.92 37.68 36.96      Physical Exam  Constitutional: She is oriented to person, place, and time.  Well developed, well nourished, NAD, polite  HENT:  Head: Normocephalic and atraumatic.  Right Ear: External ear normal.  Left Ear: External ear normal.  Nose: Nose normal.  Mouth/Throat: Oropharynx is clear and moist. No oropharyngeal exudate.  TM normal bilaterally.  Eyes: Conjunctivae are normal. Right eye exhibits no discharge. Left eye exhibits no discharge. No scleral icterus.  Neck: Normal range of motion. Neck supple. No thyromegaly present.  Cardiovascular: Normal rate, regular rhythm and normal heart sounds.  Pulmonary/Chest: Effort normal and breath sounds normal.  Musculoskeletal: She exhibits no edema.  No increased muscular tonicity of the upper trapezius bilaterally, no TTP. Normal aROM of the neck.   Neurological: She is alert and oriented to person, place, and time.  Skin: Skin is warm and dry. No rash noted. No erythema. No pallor.  Psychiatric: She has a normal mood and affect. Her behavior is normal. Thought content normal.  Vitals reviewed.    Assessment & Plan:    1. Cervicalgia - Increase gabapentin (NEURONTIN) 100 MG capsule; Take three tablets at night. One tablet in the morning and one  in the afternoon.  Dispense: 150 capsule; Refill: 5  2. Need for prophylactic vaccination and inoculation against influenza - Flu Vaccine QUAD 6+ mos PF IM (Fluarix Quad PF)  3. Elevated lipoprotein(a) - Lipid panel  4. Hypokalemia - Basic Metabolic Panel   Meds ordered this encounter  Medications  . gabapentin (NEURONTIN) 100 MG capsule    Sig: Take three tablets at night.  One tablet in the morning and one in the afternoon.    Dispense:  150 capsule    Refill:  5    Order Specific Question:   Supervising Provider    Answer:   Hoy Register [[id number]]    Follow-up: Return in about 6 months (around 12/21/2018) for Cholesterol.   Loletta Specter PA

## 2018-06-22 NOTE — Patient Instructions (Signed)
Tinnitus  (Tinnitus)  El trmino tinnitus hace referencia a la percepcin de un sonido que no se corresponde con ninguna fuente real para ese sonido. A menudo se lo describe como zumbido de odos. Sin embargo, las personas que sufren esta afeccin pueden percibir diferentes ruidos. Una persona puede percibir el sonido en uno o en ambos odos.  Los sonidos del tinnitus pueden ser suaves, fuertes o de intensidad intermedia. El tinnitus puede prolongarse pocos segundos o ser constante durante varios das. Puede desaparecer sin tratamiento y regresar en distintos momentos. Cuando el tinnitus es permanente u ocurre con frecuencia, puede causar otros problemas, por ejemplo, dificultad para dormir y para concentrarse.  Casi todas las personas tienen tinnitus en algn momento. El tinnitus a largo plazo (crnico) o que regresa con frecuencia es un problema que puede requerir atencin mdica.  CAUSAS  A menudo se desconoce la causa del tinnitus. En algunos casos, puede ser el resultado de otros problemas u otras afecciones, entre ellas:   Exposicin a ruidos fuertes de maquinarias, msica u otras fuentes.   Prdida auditiva.   Infecciones de los odos o de los senos paranasales.   Acumulacin de cerumen.   Un objeto extrao en el odo.   Uso de ciertos medicamentos.   Consumo de alcohol y cafena.   Hipertensin arterial.   Cardiopatas.   Anemia.   Alergias.   Enfermedad de Meniere.   Problemas de tiroides.   Tumores.   Dilatacin de una porcin de un vaso sanguneo debilitado (aneurisma).  SNTOMAS  El principal sntoma de tinnitus es la percepcin de un sonido que no se corresponde con ninguna fuente, no proviene de ningn lugar. El sonido puede percibirse como lo siguiente:   Timbre.   Crepitacin.   Zumbido.   El soplido del aire, similar al sonido que se percibe en una caracola.   Sibilancia.   Silbido.   Chisporroteo.   Runrn.   Una corriente de agua.   Una nota musical  sostenida.  DIAGNSTICO  El diagnstico de tinnitus se basa en los sntomas. El mdico le har un examen fsico. Se realizar un examen auditivo exhaustivo (audiometra) si el tinnitus:   Afecta un solo odo (unilateral).   Causa dificultades auditivas.   Dura ms de 6meses.  Adems, tal vez deba consultar a un mdico especialista en trastornos auditivos (audilogo). Pueden pedirle que responda un cuestionario para determinar la gravedad del tinnitus que padece. Se pueden hacer estudios para ayudar a determinar la causa y descartar otras enfermedades. Estos pueden incluir los siguientes:   Estudios de diagnstico por imgenes de la cabeza y el cerebro, por ejemplo:  ? Tomografa computarizada.  ? Resonancia magntica.   Un estudio de diagnstico por imgenes de los vasos sanguneos (angiografa).  TRATAMIENTO  A veces, el tratamiento de una enfermedad preexistente hace que el tinnitus desaparezca. Si el tinnitus contina, probablemente debe realizar alguno de los siguientes tratamientos, entre otros:   Medicamentos, como determinados antidepresivos o pastillas para dormir.   Generadores de sonido para enmascarar el tinnitus. Estos incluyen los siguientes:  ? Aparatos de sonido de mesa que reproducen sonidos relajantes para ayudarlo a dormir.  ? Dispositivos inteligentes que se adaptan al odo y reproducen sonidos o msica.  ? Un pequeo dispositivo que usa auriculares para emitir una seal con msica (estimulacin acstica neuronal). Con el tiempo, esto puede modificar las redes del cerebro y reducir la sensibilidad al tinnitus. Este dispositivo se usa en los casos muy graves cuando ningn otro tratamiento   resulta eficaz.   Terapia y orientacin para ayudarlo a controlar el estrs que significa vivir con tinnitus.   El uso de audfonos o implantes cocleares, si el tinnitus guarda relacin con la prdida de la audicin.  INSTRUCCIONES PARA EL CUIDADO EN EL HOGAR   Cuando sea posible, no permanezca en  lugares ruidosos y no se exponga a sonidos fuertes.   Use dispositivos de proteccin de la audicin, por ejemplo, tapones, cuando est expuesto a ruidos fuertes.   No consuma sustancias estimulantes, como nicotina, alcohol o cafena.   Ponga en prctica tcnicas para reducir el estrs, como meditacin, yoga o respiracin profunda.   Use un aparato de sonido de fondo, un humidificador u otros dispositivos para enmascarar el sonido del tinnitus.   Duerma con la cabeza levemente elevada. Esto puede reducir el impacto del tinnitus.   Intente descansar lo suficiente todas las noches.  SOLICITE ATENCIN MDICA SI:   Tiene tinnitus en un solo odo.   El tinnitus se prolonga durante 3semanas o ms tiempo y no se detiene.   Las medidas de cuidados en el hogar no resultan eficaces.   Tiene tinnitus despus de sufrir una lesin en la cabeza.   Tiene tinnitus junto con alguno de estos sntomas:  ? Mareos.  ? Prdida del equilibrio.  ? Nuseas y vmitos.  Esta informacin no tiene como fin reemplazar el consejo del mdico. Asegrese de hacerle al mdico cualquier pregunta que tenga.  Document Released: 09/28/2005 Document Revised: 10/19/2014 Document Reviewed: 02/28/2014  Elsevier Interactive Patient Education  2018 Elsevier Inc.

## 2018-06-23 LAB — BASIC METABOLIC PANEL
BUN / CREAT RATIO: 14 (ref 9–23)
BUN: 9 mg/dL (ref 6–24)
CHLORIDE: 103 mmol/L (ref 96–106)
CO2: 27 mmol/L (ref 20–29)
Calcium: 9.4 mg/dL (ref 8.7–10.2)
Creatinine, Ser: 0.64 mg/dL (ref 0.57–1.00)
GFR calc non Af Amer: 107 mL/min/{1.73_m2} (ref >59)
GFR, EST AFRICAN AMERICAN: 124 mL/min/{1.73_m2} (ref >59)
GLUCOSE: 103 mg/dL — AB (ref 65–99)
Potassium: 4.7 mmol/L (ref 3.5–5.2)
SODIUM: 140 mmol/L (ref 134–144)

## 2018-06-23 LAB — LIPID PANEL
CHOL/HDL RATIO: 3.8 ratio (ref 0.0–4.4)
Cholesterol, Total: 172 mg/dL (ref 100–199)
HDL: 45 mg/dL (ref >39)
LDL CALC: 102 mg/dL — AB (ref 0–99)
Triglycerides: 125 mg/dL (ref 0–149)
VLDL CHOLESTEROL CAL: 25 mg/dL (ref 5–40)

## 2018-06-24 ENCOUNTER — Other Ambulatory Visit (INDEPENDENT_AMBULATORY_CARE_PROVIDER_SITE_OTHER): Payer: Self-pay | Admitting: Physician Assistant

## 2018-06-24 ENCOUNTER — Telehealth (INDEPENDENT_AMBULATORY_CARE_PROVIDER_SITE_OTHER): Payer: Self-pay

## 2018-06-24 DIAGNOSIS — E7841 Elevated Lipoprotein(a): Secondary | ICD-10-CM | POA: Insufficient documentation

## 2018-06-24 MED ORDER — ATORVASTATIN CALCIUM 20 MG PO TABS: 20.0000 mg | ORAL_TABLET | Freq: Every day | ORAL | 3 refills | Status: DC

## 2018-06-24 NOTE — Telephone Encounter (Signed)
Call placed using pacific interpreter [name]([phone]) left message asking patient to return call to [facility] at [phone]. If patient returns call please inform that cholesterol is slightly elevated but nearly the same as last year. Atorvastatin sent to [facility]. Do not become pregnant while taking atorvastatin. Nina Hawkins, CMA

## 2018-06-24 NOTE — Telephone Encounter (Signed)
-----   Message from Loletta Specteroger David Gomez, PA-C sent at 06/24/2018  8:34 AM EDT ----- Cholesterol is slightly elevated but nearly the same as last year. I will send atorvastatin to [facility]. Take care not to become pregnant while on atorvastatin.

## 2018-06-27 ENCOUNTER — Telehealth (INDEPENDENT_AMBULATORY_CARE_PROVIDER_SITE_OTHER): Payer: Self-pay

## 2018-06-27 ENCOUNTER — Encounter (INDEPENDENT_AMBULATORY_CARE_PROVIDER_SITE_OTHER): Payer: Self-pay

## 2018-06-27 NOTE — Telephone Encounter (Signed)
-----   Message from Roger David Gomez, PA-C sent at 06/24/2018  8:34 AM EDT ----- Cholesterol is slightly elevated but nearly the same as last year. I will send atorvastatin to [facility]. Take care not to become pregnant while on atorvastatin. 

## 2018-06-27 NOTE — Telephone Encounter (Signed)
Call placed using pacific interpreter Janyth Pupaicholas [phone] left message asking patient to return call to [facility] at [phone]. If patient returns call please notify that her cholesterol is slightly elevated but it is nearly the same as last year. [facility]. Be sure to not become pregnant while on atorvastatin. Results have also been mailed as this is the second attempt to reach patient. Maryjean Mornempestt S Roberts, CMA

## 2018-09-21 ENCOUNTER — Ambulatory Visit: Payer: Self-pay

## 2018-10-14 ENCOUNTER — Ambulatory Visit: Payer: Self-pay | Attending: Family Medicine

## 2018-10-19 ENCOUNTER — Telehealth: Payer: Self-pay | Admitting: Physician Assistant

## 2018-10-19 NOTE — Telephone Encounter (Signed)
I spoke with Pt will stop-by in the morning

## 2018-11-02 ENCOUNTER — Other Ambulatory Visit: Payer: Self-pay

## 2018-11-02 ENCOUNTER — Encounter (INDEPENDENT_AMBULATORY_CARE_PROVIDER_SITE_OTHER): Payer: Self-pay | Admitting: Nurse Practitioner

## 2018-11-02 ENCOUNTER — Ambulatory Visit (INDEPENDENT_AMBULATORY_CARE_PROVIDER_SITE_OTHER): Payer: Self-pay | Admitting: Nurse Practitioner

## 2018-11-02 VITALS — BP 122/87 | HR 89 | Temp 97.8°F | Resp 16 | Ht <= 58 in | Wt 176.0 lb

## 2018-11-02 DIAGNOSIS — R1084 Generalized abdominal pain: Secondary | ICD-10-CM

## 2018-11-02 MED ORDER — OMEPRAZOLE 20 MG PO CPDR: 20.0000 mg | DELAYED_RELEASE_CAPSULE | Freq: Every day | ORAL | 1 refills | Status: DC

## 2018-11-02 MED FILL — OMEPRAZOLE 20 MG CAP: 20 | 30 days supply | Qty: 30 | Fill #0

## 2018-11-02 NOTE — Progress Notes (Signed)
Abdominal pain after eating, feels like stomach swells for several months  Pain 3/10

## 2018-11-02 NOTE — Progress Notes (Signed)
Assessment & Plan:  Nina Hawkins was seen today for abdominal pain.  Diagnoses and all orders for this visit:  Generalized abdominal pain -     CMP14+EGFR -     CBC -     H. pylori breath test -     omeprazole (PRILOSEC) 20 MG capsule; Take 1 capsule (20 mg total) by mouth daily.    Patient has been counseled on age-appropriate routine health concerns for screening and prevention. These are reviewed and up-to-date. Referrals have been placed accordingly. Immunizations are up-to-date or declined.    Subjective:   Chief Complaint  Patient presents with  . Abdominal Pain   HPI Stefanie Hodgens 48 y.o. female presents to office today with complaints of chronic abdominal pain. VRI was used to communicate directly with patient for the entire encounter including providing detailed patient instructions.    Abdominal Pain Onset 3 months ago. She has a positive history of H pylori in 2018. She was prescribed clarithromycin 500 mg BID and amoxicillin 500 mg. She endorses medication compliance. Other symptoms include intermittent constipation (last bowel movement this morning), bloating, increased gas. Pain is described as aching.  Aggravating factors:after eating meals. She denies heartburn, nausea, vomiting or diarrhea. She has not taken any OTC medications. Pain is generalized. Relieving factors: NONE  Review of Systems  Constitutional: Negative for fever, malaise/fatigue and weight loss.  HENT: Negative.  Negative for nosebleeds.   Eyes: Negative.  Negative for blurred vision, double vision and photophobia.  Respiratory: Negative.  Negative for cough and shortness of breath.   Cardiovascular: Negative.  Negative for chest pain, palpitations and leg swelling.  Gastrointestinal: Positive for abdominal pain and constipation. Negative for blood in stool, diarrhea, heartburn, melena, nausea and vomiting.  Musculoskeletal: Negative.  Negative for myalgias.  Neurological: Negative.  Negative for  dizziness, focal weakness, seizures and headaches.  Psychiatric/Behavioral: Negative.  Negative for suicidal ideas.    History reviewed. No pertinent past medical history.  Past Surgical History:  Procedure Laterality Date  . ABDOMINAL HYSTERECTOMY      History reviewed. No pertinent family history.  Social History Reviewed with no changes to be made today.   Outpatient Medications Prior to Visit  Medication Sig Dispense Refill  . atorvastatin (LIPITOR) 20 MG tablet Take 1 tablet (20 mg total) by mouth daily. (Patient not taking: Reported on 11/02/2018) 90 tablet 3  . gabapentin (NEURONTIN) 100 MG capsule Take three tablets at night. One tablet in the morning and one in the afternoon. (Patient not taking: Reported on 11/02/2018) 150 capsule 5   No facility-administered medications prior to visit.     No Known Allergies     Objective:    BP 122/87 (BP Location: Left Arm, Patient Position: Sitting, Cuff Size: Large)   Pulse 89   Temp 97.8 F (36.6 C) (Oral)   Resp 16   Ht 4' 9"  (1.448 m)   Wt 176 lb (79.8 kg)   SpO2 97%   BMI 38.09 kg/m  Wt Readings from Last 3 Encounters:  11/02/18 176 lb (79.8 kg)  06/22/18 173 lb 9.6 oz (78.7 kg)  05/19/18 177 lb 3.2 oz (80.4 kg)    Physical Exam Vitals signs and nursing note reviewed.  Constitutional:      Appearance: She is well-developed.  HENT:     Head: Normocephalic and atraumatic.  Neck:     Musculoskeletal: Normal range of motion.  Cardiovascular:     Rate and Rhythm: Normal rate and regular rhythm.  Heart sounds: Normal heart sounds. No murmur. No friction rub. No gallop.   Pulmonary:     Effort: Pulmonary effort is normal. No tachypnea or respiratory distress.     Breath sounds: Normal breath sounds. No decreased breath sounds, wheezing, rhonchi or rales.  Chest:     Chest wall: No tenderness.  Abdominal:     General: Abdomen is protuberant. Bowel sounds are normal.     Palpations: Abdomen is soft.      Tenderness: There is no abdominal tenderness. There is no right CVA tenderness, left CVA tenderness, guarding or rebound.     Hernia: No hernia is present.  Musculoskeletal: Normal range of motion.  Skin:    General: Skin is warm and dry.  Neurological:     Mental Status: She is alert and oriented to person, place, and time.     Coordination: Coordination normal.  Psychiatric:        Behavior: Behavior normal. Behavior is cooperative.        Thought Content: Thought content normal.        Judgment: Judgment normal.          Patient has been counseled extensively about nutrition and exercise as well as the importance of adherence with medications and regular follow-up. The patient was given clear instructions to go to ER or return to medical center if symptoms don't improve, worsen or new problems develop. The patient verbalized understanding.   Follow-up: Return in about 4 weeks (around 11/30/2018) for abdominal pain.   Gildardo Pounds, FNP-BC [facility] [place], [place]   11/02/2018, 12:46 PM

## 2018-11-02 NOTE — Patient Instructions (Signed)
Infeccin por Helicobacter Pylori  Helicobacter Pylori Infection  La infeccin por Helicobacter pylori es una infeccin bacteriana en el estmago. Las infecciones a largo plazo (crnicas) pueden causar irritacin en el estmago (gastritis), lceras en el estmago (lceras gstricas) y lceras en la parte superior del intestino (lceras duodenales). Tener esta infeccin tambin puede aumentar su riesgo de padecer cncer de estmago y un tipo de cncer de los glbulos blancos (linfoma) que afecta al estmago.  Cules son las causas?  Esta infeccin es causada por la bacteria Helicobacter pylori (H. pylori). Muchas personas saludables tienen esta bacteria en el revestimiento del estmago. Adems, la bacteria puede contagiarse de una persona a otra por contacto a travs de la materia fecal (heces) o la saliva. No se sabe por qu algunas personas desarrollan lceras, gastritis o cncer a partir de la bacteria.  Qu incrementa el riesgo?  Es ms probable que tenga esta afeccin si:   Tiene familiares con esta infeccin.   Convive con muchas otras personas, como en un dormitorio.   Es de origen africano, hispano o asitico.  Cules son los signos o los sntomas?  La mayora de las personas con esta infeccin no tienen sntomas. Si tiene sntomas, estos pueden incluir los siguientes:   Acidez estomacal.   Dolor de estmago.   Nuseas.   Vmitos. Puede presentar sangre en el vmito a causa de las lceras.   Prdida del apetito.   Mal aliento.  Cmo se diagnostica?  Esta afeccin se puede diagnosticar en funcin de lo siguiente:   Los sntomas y antecedentes mdicos.   Un examen fsico.   Anlisis de sangre.   Pruebas de heces.   Una prueba del aliento.   Un procedimiento que consiste en colocarle un tubo con una cmara en el extremo por la garganta para examinarle el estmago y la parte superior del intestino (endoscopa alta).   Extraccin y anlisis de una muestra de tejido del revestimiento del  estmago (biopsia). Una biopsia puede realizarse durante una endoscopa alta.  Cmo se trata?    Esta afeccin se trata mediante una combinacin de medicamentos (terapia triple) durante varias semanas. La terapia triple incluye un medicamento para reducir la cantidad de cido en el estmago y dos tipos de antibiticos. Este tratamiento puede reducir el riesgo de padecer cncer.  Despus del tratamiento, es posible que deba volver a realizarse una prueba de H. pylori. En algunos casos, es posible que el tratamiento se deba repetir si no ha eliminado todas las bacterias.  Siga estas indicaciones en su casa:     Tome los medicamentos de venta libre y los recetados solamente como se lo haya indicado el mdico.   Tome los antibiticos como se lo haya indicado el mdico. No deje de tomar los antibiticos aunque comience a sentirse mejor.   Retome sus actividades normales como se lo haya indicado el mdico. Pregntele al mdico qu actividades son seguras para usted.   Tome estas medidas para evitar infecciones futuras:  ? Lvese las manos frecuentemente con agua y jabn. Use desinfectante para manos si no dispone de agua y jabn.  ? No coma alimentos ni beba agua que pueda haber estado en contacto con heces o saliva.   Concurra a todas las visitas de control como se lo haya indicado el mdico. Esto es importante. Es posible que necesite realizarse estudios para comprobar que el tratamiento funcion.  Comunquese con un mdico si sus sntomas:   No mejoran con el tratamiento.   Regresan   despus del tratamiento.  Resumen   La infeccin por Helicobacter pylori es una infeccin estomacal causada por la bacteria Helicobacter pylori (H. pylori).   Esta infeccin puede causar irritacin en el estmago (gastritis), lceras en el estmago (lceras gstricas) y lceras en la parte superior del intestino (lceras duodenales).   Esta afeccin se trata mediante una combinacin de medicamentos (terapia triple) durante varias  semanas.   Tome los antibiticos como se lo haya indicado el mdico. No deje de tomar los antibiticos aunque comience a sentirse mejor.  Esta informacin no tiene como fin reemplazar el consejo del mdico. Asegrese de hacerle al mdico cualquier pregunta que tenga.  Document Released: 01/20/2016 Document Revised: 11/16/2017 Document Reviewed: 11/16/2017  Elsevier Interactive Patient Education  2019 Elsevier Inc.

## 2018-11-03 LAB — CMP14+EGFR
A/G RATIO: 2 (ref 1.2–2.2)
ALT: 12 IU/L (ref 0–32)
AST: 11 IU/L (ref 0–40)
Albumin: 4.4 g/dL (ref 3.8–4.8)
Alkaline Phosphatase: 81 IU/L (ref 39–117)
BUN/Creatinine Ratio: 16 (ref 9–23)
BUN: 9 mg/dL (ref 6–24)
CO2: 23 mmol/L (ref 20–29)
Calcium: 9.5 mg/dL (ref 8.7–10.2)
Chloride: 100 mmol/L (ref 96–106)
Creatinine, Ser: 0.56 mg/dL — ABNORMAL LOW (ref 0.57–1.00)
GFR calc Af Amer: 128 mL/min/{1.73_m2} (ref >59)
GFR calc non Af Amer: 111 mL/min/{1.73_m2} (ref >59)
GLOBULIN, TOTAL: 2.2 g/dL (ref 1.5–4.5)
Glucose: 97 mg/dL (ref 65–99)
POTASSIUM: 4.2 mmol/L (ref 3.5–5.2)
SODIUM: 144 mmol/L (ref 134–144)
Total Protein: 6.6 g/dL (ref 6.0–8.5)

## 2018-11-03 LAB — CBC
HEMOGLOBIN: 14.1 g/dL (ref 11.1–15.9)
Hematocrit: 43.5 % (ref 34.0–46.6)
MCH: 27.3 pg (ref 26.6–33.0)
MCHC: 32.4 g/dL (ref 31.5–35.7)
MCV: 84 fL (ref 79–97)
PLATELETS: 300 10*3/uL (ref 150–450)
RBC: 5.17 x10E6/uL (ref 3.77–5.28)
RDW: 13.8 % (ref 11.7–15.4)
WBC: 6.5 10*3/uL (ref 3.4–10.8)

## 2018-11-03 LAB — H. PYLORI BREATH TEST: H PYLORI BREATH TEST: POSITIVE — AB

## 2018-11-07 ENCOUNTER — Other Ambulatory Visit: Payer: Self-pay | Admitting: Nurse Practitioner

## 2018-11-07 DIAGNOSIS — R1084 Generalized abdominal pain: Secondary | ICD-10-CM

## 2018-11-07 MED ORDER — AMOXICILLIN 500 MG PO TABS: 1000.0000 mg | ORAL_TABLET | Freq: Two times a day (BID) | ORAL | 0 refills | Status: AC

## 2018-11-07 MED ORDER — OMEPRAZOLE 20 MG PO CPDR: 20.0000 mg | DELAYED_RELEASE_CAPSULE | Freq: Two times a day (BID) | ORAL | 0 refills | Status: DC

## 2018-11-07 MED ORDER — CLARITHROMYCIN 500 MG PO TABS: 500.0000 mg | ORAL_TABLET | Freq: Two times a day (BID) | ORAL | 0 refills | Status: AC

## 2018-11-08 MED FILL — CLARITHROMYCIN 500 MG TAB: 500 | 14 days supply | Qty: 28 | Fill #0

## 2018-11-08 MED FILL — AMOXICILLIN 500 MG CAPSULE: 500 | 14 days supply | Qty: 56 | Fill #0

## 2018-11-08 MED FILL — OMEPRAZOLE 20 MG CAP: 20 | 14 days supply | Qty: 28 | Fill #0

## 2018-11-15 ENCOUNTER — Telehealth (INDEPENDENT_AMBULATORY_CARE_PROVIDER_SITE_OTHER): Payer: Self-pay

## 2018-11-15 NOTE — Telephone Encounter (Signed)
Patient returned call to clinic to obtain lab results. With the assistance of bilingual front office staff patient was notified that she was positive for stomach bacteria H pylori; which is the same bacteria she was treated for in the past. Patient was advised to take all medication as prescribed. She is aware that there will be several medications she will have to take daily and they have all been sent to the pharmacy. She is aware that she will be retested after the medications are completed to make sure treatment was successful. Maryjean Morn, CMA

## 2018-11-15 NOTE — Telephone Encounter (Signed)
-----   Message from Claiborne RiggZelda W Fleming, NP sent at 11/07/2018  9:38 PM EST ----- Your test was positive for Hpylori which is the same stomach bacteria you were treated for in the past. Please make sure you take all your medication as prescribed. It will be several medications you have to take daily that will be sent to the pharmacy.  You will be retested after you complete the medication in a few months to make sure you have been treated.

## 2018-11-17 MED FILL — AMOXICILLIN 500 MG CAPSULE: 500 | 14 days supply | Qty: 56 | Fill #0

## 2018-11-17 MED FILL — CLARITHROMYCIN 500 MG TAB: 500 | 14 days supply | Qty: 28 | Fill #0

## 2018-11-17 MED FILL — OMEPRAZOLE 20 MG CAP: 20 | 14 days supply | Qty: 28 | Fill #0

## 2018-11-30 ENCOUNTER — Ambulatory Visit (INDEPENDENT_AMBULATORY_CARE_PROVIDER_SITE_OTHER): Payer: Self-pay | Admitting: Primary Care

## 2018-11-30 ENCOUNTER — Encounter (INDEPENDENT_AMBULATORY_CARE_PROVIDER_SITE_OTHER): Payer: Self-pay | Admitting: Primary Care

## 2018-11-30 ENCOUNTER — Other Ambulatory Visit: Payer: Self-pay

## 2018-11-30 VITALS — BP 123/86 | HR 71 | Temp 97.7°F | Ht <= 58 in | Wt 172.6 lb

## 2018-11-30 DIAGNOSIS — R1084 Generalized abdominal pain: Secondary | ICD-10-CM

## 2018-11-30 NOTE — Progress Notes (Signed)
Established Patient Office Visit  Subjective:  Patient ID: Nina Hawkins, female    DOB: 1971/09/07  Age: 48 y.o. MRN: [id number]  CC:  Chief Complaint  Patient presents with  . Follow-up    abdominal pain    HPI Lency Blankenbiller presents for follow up on abdominal pain and she was recently dx and tx for H.pyloric. She did not understand she needed to rtn when tx was complete. The use of the status- Spanish and printed information was  given in Spanish on H. Pyloric. Patient voiced understanding.   History reviewed. No pertinent past medical history.  Past Surgical History:  Procedure Laterality Date  . ABDOMINAL HYSTERECTOMY      History reviewed. No pertinent family history.  Social History   Socioeconomic History  . Marital status: Single    Spouse name: Not on file  . Number of children: Not on file  . Years of education: Not on file  . Highest education level: Not on file  Occupational History  . Not on file  Social Needs  . Financial resource strain: Not on file  . Food insecurity:    Worry: Not on file    Inability: Not on file  . Transportation needs:    Medical: Not on file    Non-medical: Not on file  Tobacco Use  . Smoking status: Never Smoker  . Smokeless tobacco: Never Used  Substance and Sexual Activity  . Alcohol use: Yes  . Drug use: No  . Sexual activity: Yes  Lifestyle  . Physical activity:    Days per week: Not on file    Minutes per session: Not on file  . Stress: Not on file  Relationships  . Social connections:    Talks on phone: Not on file    Gets together: Not on file    Attends religious service: Not on file    Active member of club or organization: Not on file    Attends meetings of clubs or organizations: Not on file    Relationship status: Not on file  . Intimate partner violence:    Fear of current or ex partner: Not on file    Emotionally abused: Not on file    Physically abused: Not on file    Forced  sexual activity: Not on file  Other Topics Concern  . Not on file  Social History Narrative  . Not on file    Outpatient Medications Prior to Visit  Medication Sig Dispense Refill  . amoxicillin (AMOXIL) 500 MG capsule Take 500 mg by mouth.    Marland Kitchen omeprazole (PRILOSEC) 20 MG capsule Take 1 capsule (20 mg total) by mouth 2 (two) times daily before a meal for 14 days. 28 capsule 0   No facility-administered medications prior to visit.     No Known Allergies  ROS Review of Systems  Constitutional: Negative.   HENT: Negative.   Eyes: Negative.   Respiratory: Negative.   Gastrointestinal: Positive for abdominal distention and abdominal pain.  Endocrine: Negative.   Genitourinary: Negative.   Allergic/Immunologic: Negative.   Neurological: Negative.         Objective:    Physical Exam  Constitutional: She appears well-nourished.  HENT:  Head: Normocephalic.  Eyes: Pupils are equal, round, and reactive to light. EOM are normal.  Neck: Normal range of motion. Neck supple.  Cardiovascular: Normal rate and regular rhythm.  Pulmonary/Chest: Effort normal and breath sounds normal.  Abdominal: Soft. Bowel sounds are normal.  She exhibits distension.  Musculoskeletal: Normal range of motion.  Neurological: She is alert. She has normal reflexes.  Skin: Skin is warm and dry.  Psychiatric: She has a normal mood and affect.    BP 123/86 (BP Location: Right Arm, Patient Position: Sitting, Cuff Size: Large)   Pulse 71   Temp 97.7 F (36.5 C) (Oral)   Ht 4\' 9"  (1.448 m)   Wt 172 lb 9.6 oz (78.3 kg)   SpO2 92%   BMI 37.35 kg/m  Wt Readings from Last 3 Encounters:  11/30/18 172 lb 9.6 oz (78.3 kg)  11/02/18 176 lb (79.8 kg)  06/22/18 173 lb 9.6 oz (78.7 kg)     There are no preventive care reminders to display for this patient.  There are no preventive care reminders to display for this patient.  Lab Results  Component Value Date   TSH 4.000 12/25/2016   Lab Results   Component Value Date   WBC 6.5 11/02/2018   HGB 14.1 11/02/2018   HCT 43.5 11/02/2018   MCV 84 11/02/2018   PLT 300 11/02/2018   Lab Results  Component Value Date   NA 144 11/02/2018   K 4.2 11/02/2018   CO2 23 11/02/2018   GLUCOSE 97 11/02/2018   BUN 9 11/02/2018   CREATININE 0.56 (L) 11/02/2018   BILITOT <0.2 11/02/2018   ALKPHOS 81 11/02/2018   AST 11 11/02/2018   ALT 12 11/02/2018   PROT 6.6 11/02/2018   ALBUMIN 4.4 11/02/2018   CALCIUM 9.5 11/02/2018   ANIONGAP 11 12/24/2017   Lab Results  Component Value Date   CHOL 172 06/22/2018   Lab Results  Component Value Date   HDL 45 06/22/2018   Lab Results  Component Value Date   LDLCALC 102 (H) 06/22/2018   Lab Results  Component Value Date   TRIG 125 06/22/2018   Lab Results  Component Value Date   CHOLHDL 3.8 06/22/2018   Lab Results  Component Value Date   HGBA1C 5.7 (A) 05/19/2018      Assessment & Plan:   Problem List Items Addressed This Visit    None    Visit Diagnoses    Generalized abdominal pain    -  Primary  Follow up on abdominal pain dx. H. Pyloric in previous visit . Too early for re-valuation    Follow-up: Return for when all medication for h pylori are completed.    Grayce Sessions, NP

## 2018-11-30 NOTE — Patient Instructions (Signed)
Infeccin por Helicobacter Pylori  Helicobacter Pylori Infection  La infeccin por Helicobacter pylori es una infeccin bacteriana en el estmago. Las infecciones a largo plazo (crnicas) pueden causar irritacin en el estmago (gastritis), lceras en el estmago (lceras gstricas) y lceras en la parte superior del intestino (lceras duodenales). Tener esta infeccin tambin puede aumentar su riesgo de padecer cncer de estmago y un tipo de cncer de los glbulos blancos (linfoma) que afecta al estmago.  Cules son las causas?  Esta infeccin es causada por la bacteria Helicobacter pylori (H. pylori). Muchas personas saludables tienen esta bacteria en el revestimiento del estmago. Adems, la bacteria puede contagiarse de una persona a otra por contacto a travs de la materia fecal (heces) o la saliva. No se sabe por qu algunas personas desarrollan lceras, gastritis o cncer a partir de la bacteria.  Qu incrementa el riesgo?  Es ms probable que tenga esta afeccin si:   Tiene familiares con esta infeccin.   Convive con muchas otras personas, como en un dormitorio.   Es de origen africano, hispano o asitico.  Cules son los signos o los sntomas?  La mayora de las personas con esta infeccin no tienen sntomas. Si tiene sntomas, estos pueden incluir los siguientes:   Acidez estomacal.   Dolor de estmago.   Nuseas.   Vmitos. Puede presentar sangre en el vmito a causa de las lceras.   Prdida del apetito.   Mal aliento.  Cmo se diagnostica?  Esta afeccin se puede diagnosticar en funcin de lo siguiente:   Los sntomas y antecedentes mdicos.   Un examen fsico.   Anlisis de sangre.   Pruebas de heces.   Una prueba del aliento.   Un procedimiento que consiste en colocarle un tubo con una cmara en el extremo por la garganta para examinarle el estmago y la parte superior del intestino (endoscopa alta).   Extraccin y anlisis de una muestra de tejido del revestimiento del  estmago (biopsia). Una biopsia puede realizarse durante una endoscopa alta.  Cmo se trata?    Esta afeccin se trata mediante una combinacin de medicamentos (terapia triple) durante varias semanas. La terapia triple incluye un medicamento para reducir la cantidad de cido en el estmago y dos tipos de antibiticos. Este tratamiento puede reducir el riesgo de padecer cncer.  Despus del tratamiento, es posible que deba volver a realizarse una prueba de H. pylori. En algunos casos, es posible que el tratamiento se deba repetir si no ha eliminado todas las bacterias.  Siga estas indicaciones en su casa:     Tome los medicamentos de venta libre y los recetados solamente como se lo haya indicado el mdico.   Tome los antibiticos como se lo haya indicado el mdico. No deje de tomar los antibiticos aunque comience a sentirse mejor.   Retome sus actividades normales como se lo haya indicado el mdico. Pregntele al mdico qu actividades son seguras para usted.   Tome estas medidas para evitar infecciones futuras:  ? Lvese las manos frecuentemente con agua y jabn. Use desinfectante para manos si no dispone de agua y jabn.  ? No coma alimentos ni beba agua que pueda haber estado en contacto con heces o saliva.   Concurra a todas las visitas de control como se lo haya indicado el mdico. Esto es importante. Es posible que necesite realizarse estudios para comprobar que el tratamiento funcion.  Comunquese con un mdico si sus sntomas:   No mejoran con el tratamiento.   Regresan   despus del tratamiento.  Resumen   La infeccin por Helicobacter pylori es una infeccin estomacal causada por la bacteria Helicobacter pylori (H. pylori).   Esta infeccin puede causar irritacin en el estmago (gastritis), lceras en el estmago (lceras gstricas) y lceras en la parte superior del intestino (lceras duodenales).   Esta afeccin se trata mediante una combinacin de medicamentos (terapia triple) durante varias  semanas.   Tome los antibiticos como se lo haya indicado el mdico. No deje de tomar los antibiticos aunque comience a sentirse mejor.  Esta informacin no tiene como fin reemplazar el consejo del mdico. Asegrese de hacerle al mdico cualquier pregunta que tenga.  Document Released: 01/20/2016 Document Revised: 11/16/2017 Document Reviewed: 11/16/2017  Elsevier Interactive Patient Education  2019 Elsevier Inc.

## 2019-01-03 ENCOUNTER — Ambulatory Visit (INDEPENDENT_AMBULATORY_CARE_PROVIDER_SITE_OTHER): Payer: Self-pay | Admitting: Primary Care
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
## 2019-05-03 ENCOUNTER — Ambulatory Visit (INDEPENDENT_AMBULATORY_CARE_PROVIDER_SITE_OTHER): Payer: Self-pay | Admitting: Primary Care

## 2019-05-03 ENCOUNTER — Encounter (INDEPENDENT_AMBULATORY_CARE_PROVIDER_SITE_OTHER): Payer: Self-pay | Admitting: Primary Care

## 2019-05-03 ENCOUNTER — Other Ambulatory Visit: Payer: Self-pay

## 2019-05-03 VITALS — BP 124/87 | HR 72 | Temp 98.0°F | Ht <= 58 in | Wt 176.0 lb

## 2019-05-03 DIAGNOSIS — K5900 Constipation, unspecified: Secondary | ICD-10-CM

## 2019-05-03 DIAGNOSIS — R7303 Prediabetes: Secondary | ICD-10-CM

## 2019-05-03 DIAGNOSIS — Z6838 Body mass index (BMI) 38.0-38.9, adult: Secondary | ICD-10-CM

## 2019-05-03 DIAGNOSIS — Z0000 Encounter for general adult medical examination without abnormal findings: Secondary | ICD-10-CM

## 2019-05-03 DIAGNOSIS — E7841 Elevated Lipoprotein(a): Secondary | ICD-10-CM

## 2019-05-03 DIAGNOSIS — R300 Dysuria: Secondary | ICD-10-CM

## 2019-05-03 DIAGNOSIS — S0240CA Maxillary fracture, right side, initial encounter for closed fracture: Secondary | ICD-10-CM | POA: Insufficient documentation

## 2019-05-03 DIAGNOSIS — R1013 Epigastric pain: Secondary | ICD-10-CM

## 2019-05-03 LAB — POCT URINALYSIS DIP (CLINITEK)
Bilirubin, UA: NEGATIVE
Blood, UA: NEGATIVE
Glucose, UA: NEGATIVE mg/dL
Ketones, POC UA: NEGATIVE mg/dL
Leukocytes, UA: NEGATIVE
Nitrite, UA: NEGATIVE
POC PROTEIN,UA: NEGATIVE
Spec Grav, UA: 1.025 (ref 1.010–1.025)
Urobilinogen, UA: 0.2 E.U./dL
pH, UA: 5.5 (ref 5.0–8.0)

## 2019-05-03 MED ORDER — POLYETHYLENE GLYCOL 3350 17 GM/SCOOP PO POWD: 17.0000 g | Freq: Two times a day (BID) | ORAL | 1 refills | Status: DC | PRN

## 2019-05-03 MED FILL — POLYETHYLENE GLYCOL 3350 PO: 17 | 7 days supply | Qty: 238 | Fill #0

## 2019-05-03 NOTE — Progress Notes (Signed)
Established Patient Office Visit  Subjective:  Patient ID: Nina Hawkins, female    DOB: Mar 10, 1971  Age: 48 y.o. MRN: [id number]  CC:  Chief Complaint  Patient presents with  . Annual Exam    HPI Nina Hawkins presents for a physical exam. She continues to complain of abdominal pain. This has been a chronic problem she also suffers from constipation. She voiced concerns about possibility of having H. Pyloric and would like to be tested.   History reviewed. No pertinent past medical history.  Past Surgical History:  Procedure Laterality Date  . ABDOMINAL HYSTERECTOMY      History reviewed. No pertinent family history.  Social History   Socioeconomic History  . Marital status: Single    Spouse name: Not on file  . Number of children: Not on file  . Years of education: Not on file  . Highest education level: Not on file  Occupational History  . Not on file  Social Needs  . Financial resource strain: Not on file  . Food insecurity    Worry: Not on file    Inability: Not on file  . Transportation needs    Medical: Not on file    Non-medical: Not on file  Tobacco Use  . Smoking status: Never Smoker  . Smokeless tobacco: Never Used  Substance and Sexual Activity  . Alcohol use: Yes  . Drug use: No  . Sexual activity: Yes  Lifestyle  . Physical activity    Days per week: Not on file    Minutes per session: Not on file  . Stress: Not on file  Relationships  . Social Herbalist on phone: Not on file    Gets together: Not on file    Attends religious service: Not on file    Active member of club or organization: Not on file    Attends meetings of clubs or organizations: Not on file    Relationship status: Not on file  . Intimate partner violence    Fear of current or ex partner: Not on file    Emotionally abused: Not on file    Physically abused: Not on file    Forced sexual activity: Not on file  Other Topics Concern  . Not on file   Social History Narrative  . Not on file    Outpatient Medications Prior to Visit  Medication Sig Dispense Refill  . omeprazole (PRILOSEC) 20 MG capsule Take 1 capsule (20 mg total) by mouth 2 (two) times daily before a meal for 14 days. 28 capsule 0  . amoxicillin (AMOXIL) 500 MG capsule Take 500 mg by mouth.     No facility-administered medications prior to visit.     No Known Allergies  ROS Review of Systems  Gastrointestinal: Positive for abdominal distention, abdominal pain and constipation.       Feels bloated all the time  Genitourinary: Positive for dyspareunia and flank pain.      Objective:    Physical Exam  Constitutional: She is oriented to person, place, and time. She appears well-developed and well-nourished.  HENT:  Head: Normocephalic.  Eyes: Pupils are equal, round, and reactive to light. EOM are normal.  Neck: Normal range of motion. Neck supple.  Cardiovascular: Normal rate.  Pulmonary/Chest: Effort normal and breath sounds normal.  Abdominal: Soft. Bowel sounds are normal. She exhibits distension. There is abdominal tenderness.  Musculoskeletal: Normal range of motion.  Neurological: She is oriented to person, place,  and time.  Skin: Skin is warm.  Psychiatric: She has a normal mood and affect.    BP 124/87 (BP Location: Right Arm, Patient Position: Sitting, Cuff Size: Large)   Pulse 72   Temp 98 F (36.7 C) (Oral)   Ht 4' 9"  (1.448 m)   Wt 176 lb (79.8 kg)   SpO2 96%   BMI 38.09 kg/m  Wt Readings from Last 3 Encounters:  05/03/19 176 lb (79.8 kg)  11/30/18 172 lb 9.6 oz (78.3 kg)  11/02/18 176 lb (79.8 kg)     There are no preventive care reminders to display for this patient.  There are no preventive care reminders to display for this patient.  Lab Results  Component Value Date   TSH 4.000 12/25/2016   Lab Results  Component Value Date   WBC 6.5 11/02/2018   HGB 14.1 11/02/2018   HCT 43.5 11/02/2018   MCV 84 11/02/2018   PLT  300 11/02/2018   Lab Results  Component Value Date   NA 144 11/02/2018   K 4.2 11/02/2018   CO2 23 11/02/2018   GLUCOSE 97 11/02/2018   BUN 9 11/02/2018   CREATININE 0.56 (L) 11/02/2018   BILITOT <0.2 11/02/2018   ALKPHOS 81 11/02/2018   AST 11 11/02/2018   ALT 12 11/02/2018   PROT 6.6 11/02/2018   ALBUMIN 4.4 11/02/2018   CALCIUM 9.5 11/02/2018   ANIONGAP 11 12/24/2017   Lab Results  Component Value Date   CHOL 172 06/22/2018   Lab Results  Component Value Date   HDL 45 06/22/2018   Lab Results  Component Value Date   LDLCALC 102 (H) 06/22/2018   Lab Results  Component Value Date   TRIG 125 06/22/2018   Lab Results  Component Value Date   CHOLHDL 3.8 06/22/2018   Lab Results  Component Value Date   HGBA1C 5.7 (A) 05/19/2018      Assessment & Plan:   Savon was seen today for annual exam.  Diagnoses and all orders for this visit:  Annual physical exam Patient has been counseled on age-appropriate routine health concerns for screening and prevention. These are reviewed and up-to-date. Referrals have been placed accordingly. Immunizations are up-to-date or declined.    -     CBC with Differential -     CMP14+EGFR -     Lipid Panel -     Cancel: Urinalysis  Dysuria Increase urination frequency, burning and pain maybe signs of infection. A urinalysis is completed and no signs of infection. Increase water to 64 oz daily .-      -     POCT URINALYSIS DIP (CLINITEK)  Abdominal pain, epigastric -     H. pylori Screen -     Cancel: Urinalysis  Body mass index (bmi) 38.0-38.9, adult  Obesity is 30-39 indicating an excess in caloric intake or underlining conditions. This may lead to other co-morbidities. Lifestyle modifications of diet and exercise may reduce obesity. Counseled on diet, physical activity, behavioral modification and goals for weight loss with BMI >25.  Will recheck weight in 3-6 months. -     Lipid Panel -     Cancel: Urinalysis  Elevated  lipoprotein(a)  Healthy lifestyle diet of fruits vegetables fish nuts whole grains and low saturated fat . Foods high in cholesterol or liver, fatty meats,cheese, butter avocados, nuts and seeds, chocolate and fried foods. - Lipid Panel -     Cancel: Urinalysis  Constipation, unspecified constipation type MANAGEMENT OF CHRONIC  CONSTIPATION  Drink fluids in the recommended amount everyday. Recommend amount is 8 cups of water daily. Do not replace water with Gatorade or Powerade as these should only be used when you are dehydrated.   Eat lots of high fiber foods-fruits, veggies, bran and whole grain instead of white bread  Be active everyday. Inactivity makes constipation worse.  Add psyllium daily (Metamucil) which comes in capsules now. Start very low dose and work up to recommended dose on bottle daily.  Stay away from Joice or any magnesium containing laxative, unless you need it to clear things out rarely. It is an addictive laxative and your gut will become dependent on it.  If that is not working, I would start Miralax, which you can buy in generic 17 gms daily. It's a powder and not an "addictive laxative". Take it every day and titrate the dose up or down to get the daily Bm.  We will consider the use of other pharmacological treatments should the above recommendations prove to be unsuccessful.   Prediabetes A1C 5.7 Continue blood sugar control as discussed in office today, low carbohydrate diet, and regular physical exercise as tolerated, 150 minutes per week (30 min each day, 5 days per week, or 50 min 3 days per week).   Other orders -     polyethylene glycol powder (GLYCOLAX/MIRALAX) 17 GM/SCOOP powder; Take 17 g by mouth 2 (two) times daily as needed.   Meds ordered this encounter  Medications  . polyethylene glycol powder (GLYCOLAX/MIRALAX) 17 GM/SCOOP powder    Sig: Take 17 g by mouth 2 (two) times daily as needed.    Dispense:  3350 g    Refill:  1     Follow-up: Return in about 6 months (around 11/03/2019) for hyperlipidemia , gastritis .    Kerin Perna, NP

## 2019-05-03 NOTE — Progress Notes (Signed)
Pt states her stomach is always swollen

## 2019-05-04 LAB — CBC WITH DIFFERENTIAL/PLATELET
Basophils Absolute: 0 10*3/uL (ref 0.0–0.2)
Basos: 0 %
EOS (ABSOLUTE): 0.4 10*3/uL (ref 0.0–0.4)
Eos: 6 %
Hematocrit: 45.6 % (ref 34.0–46.6)
Hemoglobin: 14.9 g/dL (ref 11.1–15.9)
Immature Grans (Abs): 0 10*3/uL (ref 0.0–0.1)
Immature Granulocytes: 0 %
Lymphocytes Absolute: 2.7 10*3/uL (ref 0.7–3.1)
Lymphs: 40 %
MCH: 27.5 pg (ref 26.6–33.0)
MCHC: 32.7 g/dL (ref 31.5–35.7)
MCV: 84 fL (ref 79–97)
Monocytes Absolute: 0.5 10*3/uL (ref 0.1–0.9)
Monocytes: 7 %
Neutrophils Absolute: 3.1 10*3/uL (ref 1.4–7.0)
Neutrophils: 47 %
Platelets: 284 10*3/uL (ref 150–450)
RBC: 5.42 x10E6/uL — ABNORMAL HIGH (ref 3.77–5.28)
RDW: 13.3 % (ref 11.7–15.4)
WBC: 6.7 10*3/uL (ref 3.4–10.8)

## 2019-05-04 LAB — LIPID PANEL
Chol/HDL Ratio: 3.2 ratio (ref 0.0–4.4)
Cholesterol, Total: 172 mg/dL (ref 100–199)
HDL: 53 mg/dL (ref >39)
LDL Calculated: 84 mg/dL (ref 0–99)
Triglycerides: 177 mg/dL — ABNORMAL HIGH (ref 0–149)
VLDL Cholesterol Cal: 35 mg/dL (ref 5–40)

## 2019-05-04 LAB — CMP14+EGFR
ALT: 20 IU/L (ref 0–32)
AST: 18 IU/L (ref 0–40)
Albumin/Globulin Ratio: 1.9 (ref 1.2–2.2)
Albumin: 4.6 g/dL (ref 3.8–4.8)
Alkaline Phosphatase: 73 IU/L (ref 39–117)
BUN/Creatinine Ratio: 25 — ABNORMAL HIGH (ref 9–23)
BUN: 16 mg/dL (ref 6–24)
Bilirubin Total: 0.3 mg/dL (ref 0.0–1.2)
CO2: 24 mmol/L (ref 20–29)
Calcium: 9.6 mg/dL (ref 8.7–10.2)
Chloride: 103 mmol/L (ref 96–106)
Creatinine, Ser: 0.65 mg/dL (ref 0.57–1.00)
GFR calc Af Amer: 122 mL/min/{1.73_m2} (ref >59)
GFR calc non Af Amer: 106 mL/min/{1.73_m2} (ref >59)
Globulin, Total: 2.4 g/dL (ref 1.5–4.5)
Glucose: 108 mg/dL — ABNORMAL HIGH (ref 65–99)
Potassium: 5 mmol/L (ref 3.5–5.2)
Sodium: 142 mmol/L (ref 134–144)
Total Protein: 7 g/dL (ref 6.0–8.5)

## 2019-05-18 ENCOUNTER — Other Ambulatory Visit: Payer: Self-pay

## 2019-05-18 ENCOUNTER — Ambulatory Visit: Payer: Self-pay | Attending: Family Medicine

## 2019-05-18 ENCOUNTER — Telehealth: Payer: Self-pay | Admitting: Primary Care

## 2019-05-18 NOTE — Telephone Encounter (Signed)
Pt was called since she has a TELE visit at 2.00pm for financial, LVM to remind her of the appt, I will call back in 10 minutes to try to do the interview

## 2019-05-19 ENCOUNTER — Ambulatory Visit: Payer: Self-pay

## 2019-06-01 ENCOUNTER — Telehealth: Payer: Self-pay | Admitting: Primary Care

## 2019-06-01 NOTE — Telephone Encounter (Signed)
Pt was sent a letter from financial dept. Inform them, that the application they submitted was incomplete, since they were missing some documentation at the time of the appointment, Pt need to reschedule and resubmit all new papers and application for CAFA and OC, P.S. old documents has been sent back to Pt and need to make a new appt °

## 2019-06-16 ENCOUNTER — Ambulatory Visit: Payer: Self-pay

## 2019-06-22 ENCOUNTER — Telehealth: Payer: Self-pay | Admitting: Primary Care

## 2019-06-22 NOTE — Telephone Encounter (Signed)
Patient called wanting to speak with you in regards to financial packet. Please follow up.

## 2019-06-22 NOTE — Telephone Encounter (Signed)
I called Pt back, LVM inform her that she need to reapply for CAFA and OC since she did not submit all the document we need it for the approval, Pt need to call back in a Monday to reschedule the appt with financial and bring back all the documents an application

## 2019-06-29 ENCOUNTER — Other Ambulatory Visit: Payer: Self-pay

## 2019-06-29 DIAGNOSIS — Z20822 Contact with and (suspected) exposure to covid-19: Secondary | ICD-10-CM

## 2019-07-01 LAB — NOVEL CORONAVIRUS, NAA: SARS-CoV-2, NAA: DETECTED — AB

## 2019-07-03 ENCOUNTER — Ambulatory Visit: Payer: Self-pay

## 2019-07-13 ENCOUNTER — Other Ambulatory Visit: Payer: Self-pay

## 2019-07-13 ENCOUNTER — Ambulatory Visit: Payer: Self-pay | Attending: Family Medicine

## 2019-07-13 DIAGNOSIS — Z20822 Contact with and (suspected) exposure to covid-19: Secondary | ICD-10-CM

## 2019-07-14 LAB — NOVEL CORONAVIRUS, NAA: SARS-CoV-2, NAA: NOT DETECTED

## 2019-07-27 ENCOUNTER — Telehealth: Payer: Self-pay | Admitting: Primary Care

## 2019-07-27 NOTE — Telephone Encounter (Signed)
Pt was sent a letter from financial dept. Inform them, that the application they submitted was incomplete, since they were missing some documentation at the time of the appointment, Pt need to reschedule and resubmit all new papers and application for CAFA and OC, P.S. old documents has been sent back by mail to the Pt and Pt. need to make a new appt. 

## 2019-08-11 ENCOUNTER — Other Ambulatory Visit: Payer: Self-pay

## 2019-08-11 ENCOUNTER — Ambulatory Visit: Payer: Self-pay | Attending: Family Medicine

## 2019-08-16 ENCOUNTER — Telehealth: Payer: Self-pay | Admitting: Primary Care

## 2019-08-16 NOTE — Telephone Encounter (Signed)
Patient called requesting to speak with Nina Hawkins regarding her financial application. Patient was informed of the note that Nina Hawkins had in her chart and still requested to speak with him. Please f/u

## 2019-08-16 NOTE — Telephone Encounter (Signed)
I called Pt LVm since she was not home

## 2019-08-17 ENCOUNTER — Telehealth: Payer: Self-pay | Admitting: Primary Care

## 2019-08-17 NOTE — Telephone Encounter (Signed)
Patient called back due to missed call please follow up

## 2019-08-17 NOTE — Telephone Encounter (Signed)
Pt was informed what she need to do to get the bank statement from the pre-paid card

## 2019-09-23 ENCOUNTER — Emergency Department (HOSPITAL_COMMUNITY)
Admission: EM | Admit: 2019-09-23 | Discharge: 2019-09-24 | Disposition: A | Discharge status: Home / Self Care | Payer: Self-pay | Attending: Emergency Medicine | Admitting: Emergency Medicine

## 2019-09-23 ENCOUNTER — Other Ambulatory Visit: Payer: Self-pay

## 2019-09-23 ENCOUNTER — Encounter (HOSPITAL_COMMUNITY): Payer: Self-pay | Admitting: *Deleted

## 2019-09-23 DIAGNOSIS — Z7289 Other problems related to lifestyle: Secondary | ICD-10-CM

## 2019-09-23 DIAGNOSIS — F10229 Alcohol dependence with intoxication, unspecified: Secondary | ICD-10-CM | POA: Insufficient documentation

## 2019-09-23 DIAGNOSIS — Z789 Other specified health status: Secondary | ICD-10-CM
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
## 2019-09-24 ENCOUNTER — Encounter (HOSPITAL_COMMUNITY): Payer: Self-pay | Admitting: Emergency Medicine

## 2019-09-24 ENCOUNTER — Emergency Department (HOSPITAL_COMMUNITY): Payer: Self-pay

## 2019-09-24 ENCOUNTER — Other Ambulatory Visit: Payer: Self-pay

## 2019-09-24 NOTE — ED Notes (Signed)
Patient denies pain and is resting comfortably.  

## 2019-09-24 NOTE — Discharge Instructions (Addendum)
Please return here as needed for any concerns.
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
## 2019-11-03 ENCOUNTER — Ambulatory Visit (INDEPENDENT_AMBULATORY_CARE_PROVIDER_SITE_OTHER): Payer: Self-pay | Admitting: Primary Care

## 2019-12-27 ENCOUNTER — Other Ambulatory Visit: Payer: Self-pay

## 2019-12-27 ENCOUNTER — Encounter (INDEPENDENT_AMBULATORY_CARE_PROVIDER_SITE_OTHER): Payer: Self-pay | Admitting: Primary Care

## 2019-12-27 ENCOUNTER — Ambulatory Visit (INDEPENDENT_AMBULATORY_CARE_PROVIDER_SITE_OTHER): Payer: Self-pay | Admitting: Primary Care

## 2019-12-27 DIAGNOSIS — K5900 Constipation, unspecified: Secondary | ICD-10-CM

## 2019-12-27 DIAGNOSIS — K1370 Unspecified lesions of oral mucosa: Secondary | ICD-10-CM

## 2019-12-27 DIAGNOSIS — R1084 Generalized abdominal pain: Secondary | ICD-10-CM

## 2019-12-27 MED ORDER — OMEPRAZOLE 20 MG PO CPDR: 20.0000 mg | DELAYED_RELEASE_CAPSULE | Freq: Every day | ORAL | 1 refills | Status: DC

## 2019-12-27 MED ORDER — POLYETHYLENE GLYCOL 3350 17 GM/SCOOP PO POWD: 17.0000 g | Freq: Two times a day (BID) | ORAL | 1 refills | Status: DC | PRN

## 2019-12-27 MED FILL — POLYETHYLENE GLYCOL 3350 PO: 17 | 7 days supply | Qty: 238 | Fill #0

## 2019-12-27 MED FILL — OMEPRAZOLE 20 MG CAP: 20 | 30 days supply | Qty: 30 | Fill #0

## 2019-12-27 NOTE — Progress Notes (Signed)
No longer having pain or itching throat Was having a dry cough at night  Feels like now when she eats food it gets stuck feels like she has little bumps on the roof of her mouth

## 2019-12-27 NOTE — Progress Notes (Signed)
Virtual Visit via Telephone Note  I connected with [name] on 12/27/19 at 11:10 AM EDT by telephone and verified that I am speaking with the correct person using two identifiers.   I discussed the limitations, risks, security and privacy concerns of performing an evaluation and management service by telephone and the availability of in person appointments. I also discussed with the patient that there may be a patient responsible charge related to this service. The patient expressed understanding and agreed to proceed.   History of Present Illness: [name] is a 49 year old Hispanic female having a tele visit for originally had complaints of  having pain and itching throat which has self resolved. She now complains of  having a dry cough at night  feels like now when she eats food it gets stuck in her throat . Also voiced  she has little bumps on the roof of her mouth that she can feels with her tongue.   No past medical history on file.  Current Outpatient Medications on File Prior to Visit  Medication Sig Dispense Refill  . omeprazole (PRILOSEC) 20 MG capsule Take 1 capsule (20 mg total) by mouth 2 (two) times daily before a meal for 14 days. 28 capsule 0  . polyethylene glycol powder (GLYCOLAX/MIRALAX) 17 GM/SCOOP powder Take 17 g by mouth 2 (two) times daily as needed. (Patient not taking: Reported on 12/27/2019) 3350 g 1   No current facility-administered medications on file prior to visit.    Observations/Objective: Review of Systems  HENT:       Bumps in roof of mouth   Gastrointestinal: Positive for abdominal pain, constipation and heartburn.  All other systems reviewed and are negative.  Assessment and Plan: Alsie was seen today for oral issues.  Diagnoses and all orders for this visit:  Oral lesion Unclear what bumps or etiology patient has not taken a look at her mouth to identify suggested zinc oxyzide and improving oral hygeine  Constipation,  unspecified constipation type  Encourage to drink fluids in the recommended amount everyday. Recommend amount is 8 cups of water daily. Do not replace water with Gatorade or Powerade as these should only be used when you are dehydrated. Eat lots of high fiber foods-fruits, veggies, bran and whole grain instead of white bread  -     polyethylene glycol powder (GLYCOLAX/MIRALAX) 17 GM/SCOOP powder; Take 17 g by mouth 2 (two) times daily as needed.  Generalized abdominal pain Discussed eating small frequent meal, reduction in acidic foods, fried foods ,spicy foods, alcohol caffeine and tobacco and certain medications. Avoid laying down after eating 30mins-1hour, elevated head of the bed.  -     omeprazole (PRILOSEC) 20 MG capsule; Take 1 capsule (20 mg total) by mouth daily.    Follow Up Instructions:    I discussed the assessment and treatment plan with the patient. The patient was provided an opportunity to ask questions and all were answered. The patient agreed with the plan and demonstrated an understanding of the instructions.   The patient was advised to call back or seek an in-person evaluation if the symptoms worsen or if the condition fails to improve as anticipated.  I provided  13 minutes of non-face-to-face time during this encounter.   Grayce Sessions, NP

## 2020-12-05 ENCOUNTER — Encounter (INDEPENDENT_AMBULATORY_CARE_PROVIDER_SITE_OTHER): Payer: Self-pay | Admitting: Primary Care

## 2020-12-05 ENCOUNTER — Ambulatory Visit (INDEPENDENT_AMBULATORY_CARE_PROVIDER_SITE_OTHER): Payer: Self-pay | Admitting: Primary Care

## 2020-12-05 ENCOUNTER — Other Ambulatory Visit: Payer: Self-pay | Admitting: Primary Care

## 2020-12-05 ENCOUNTER — Other Ambulatory Visit: Payer: Self-pay

## 2020-12-05 VITALS — BP 124/89 | HR 87 | Temp 95.2°F | Ht <= 58 in | Wt 186.6 lb

## 2020-12-05 DIAGNOSIS — L249 Irritant contact dermatitis, unspecified cause: Secondary | ICD-10-CM

## 2020-12-05 DIAGNOSIS — Z1211 Encounter for screening for malignant neoplasm of colon: Secondary | ICD-10-CM

## 2020-12-05 DIAGNOSIS — Z124 Encounter for screening for malignant neoplasm of cervix: Secondary | ICD-10-CM

## 2020-12-05 DIAGNOSIS — Z23 Encounter for immunization: Secondary | ICD-10-CM

## 2020-12-05 DIAGNOSIS — Z1231 Encounter for screening mammogram for malignant neoplasm of breast: Secondary | ICD-10-CM

## 2020-12-05 MED ORDER — HYDROXYZINE HCL 10 MG PO TABS: 10.0000 mg | ORAL_TABLET | Freq: Three times a day (TID) | ORAL | 0 refills | Status: DC | PRN

## 2020-12-05 MED FILL — hydrOXYzine HCL 10 MG TABS: 10 | 10 days supply | Qty: 30 | Fill #0

## 2020-12-05 NOTE — Patient Instructions (Signed)
Gripe en los adultos Influenza, Adult A la gripe tambin se la conoce como "influenza". Es una infeccin en los pulmones, la nariz y la garganta (vas respiratorias). Se transmite fcilmente de persona a persona (es contagiosa). La gripe causa sntomas que son como los de un resfro, junto con fiebre alta y dolores corporales. Cules son las causas? La causa de esta afeccin es el virus de la influenza. Puede contraer el virus de las siguientes maneras:  Al inhalar gotitas que quedan en el aire despus de que una persona infectada con gripe tosi o estornud.  Al tocar algo que est contaminado con el virus y luego llevarse la mano a la boca, la nariz o los ojos. Qu incrementa el riesgo? Hay ciertas cosas que lo pueden hacer ms propenso a tener gripe. Estas incluyen lo siguiente:  No lavarse las manos con frecuencia.  Tener contacto cercano con muchas personas durante la temporada de resfro y gripe.  Tocarse la boca, los ojos o la nariz sin antes lavarse las manos.  No recibir la vacuna antigripal todos los aos. Puede correr un mayor riesgo de tener gripe, y problemas graves, como una infeccin pulmonar (neumona), si usted:  Es mayor de 65 aos de edad.  Est embarazada.  Tiene debilitado el sistema que combate las defensas (sistema inmunitario) debido a una enfermedad o a que toma determinados medicamentos.  Tiene una afeccin a largo plazo (crnica), como las siguientes: ? Enfermedad cardaca, renal o pulmonar. ? Diabetes. ? Asma.  Tiene un trastorno heptico.  Tiene mucho sobrepeso (obesidad mrbida).  Tiene anemia. Cules son los signos o sntomas? Los sntomas normalmente comienzan de repente y duran entre 4 y 14 das. Pueden incluir los siguientes:  Fiebre y escalofros.  Dolores de cabeza, dolores en el cuerpo o dolores musculares.  Dolor de garganta.  Tos.  Secrecin o congestin nasal.  Molestias en el pecho.  No querer comer tanto como lo hace  normalmente.  Sensacin de debilidad o cansancio.  Mareos.  Malestar estomacal o vmitos. Cmo se trata? Si la gripe se detecta de forma temprana, puede recibir tratamiento con medicamentos antivirales. Esto puede ayudar a reducir la gravedad y la duracin de la enfermedad. Se los administrarn por boca o a travs de un tubo (catter) intravenoso. Cuidarse en su hogar puede ayudar a que mejoren los sntomas. El mdico puede recomendarle lo siguiente:  Tomar medicamentos de venta libre.  Beber abundante lquido. La gripe suele desaparecer sola. Si tiene sntomas muy graves u otros problemas, puede recibir tratamiento en un hospital. Siga estas instrucciones en su casa: Actividad  Descanse todo lo que sea necesario. Duerma lo suficiente.  Qudese en su casa y no concurra al trabajo o a la escuela, como se lo haya indicado el mdico. ? No salga de su casa hasta que no haya tenido fiebre por 24horas sin tomar medicamentos. ? Salga de su casa solamente para ir al mdico. Comida y bebida  Tome una SRO (solucin de rehidratacin oral). Es una bebida que se vende en farmacias y tiendas.  Beba suficiente lquido como para mantener la orina de color amarillo plido.  En la medida en que pueda, beba lquidos transparentes en pequeas cantidades. Los lquidos transparentes son, por ejemplo: ? Agua. ? Trocitos de hielo. ? Jugo de frutas mezclado con agua. ? Bebidas deportivas de bajas caloras.  Coma alimentos suaves que sean fciles de digerir. En la medida que pueda, consuma pequeas cantidades. Estos alimentos incluyen: ? Bananas. ? Pur de manzana. ?   Arroz. ? Carnes magras. ? Tostadas. ? Galletas.  No coma ni beba lo siguiente: ? Lquidos con alto contenido de azcar o cafena. ? Alcohol. ? Alimentos condimentados o con alto contenido de grasa. Indicaciones generales  Use los medicamentos de venta libre y los recetados solamente como se lo haya indicado el mdico.  Use un  humidificador de aire fro para que el aire de su casa est ms hmedo. Esto puede facilitar la respiracin. ? Cuando utilice un humidificador de vapor fro, lmpielo a diario. Vace el agua y cmbiela por agua limpia.  Al toser o estornudar, cbrase la boca y la nariz.  Lvese las manos frecuentemente con agua y jabn y durante al menos 20 segundos. Esto tambin es importante despus de toser o estornudar. Si no dispone de agua y jabn, use desinfectante para manos con alcohol.  Cumpla con todas las visitas de seguimiento.      Cmo se previene?  Colquese la vacuna antigripal todos los aos. Puede colocarse la vacuna contra la gripe a fines de verano, en otoo o en invierno. Pregntele al mdico cundo debe aplicarse la vacuna contra la gripe.  Evite el contacto con personas que estn enfermas durante el otoo y el invierno. Es la temporada del resfro y la gripe.   Comunquese con un mdico si:  Tiene sntomas nuevos.  Tiene los siguientes sntomas: ? Dolor de pecho. ? Materia fecal lquida (diarrea). ? Fiebre.  La tos empeora.  Empieza a tener ms mucosidad.  Tiene malestar estomacal.  Vomita. Solicite ayuda de inmediato si:  Le falta el aire.  Tiene dificultad para respirar.  La piel o las uas se ponen de un color azulado.  Presenta dolor muy intenso o rigidez en el cuello.  Tiene dolor de cabeza repentino.  Le duele la cara o el odo de forma repentina.  No puede comer ni beber sin vomitar. Estos sntomas pueden representar un problema grave que constituye una emergencia. Solicite atencin mdica de inmediato. Comunquese con el servicio de emergencias de su localidad (911 en los [place]).  No espere a ver si los sntomas desaparecen.  No conduzca por sus propios medios hasta el hospital. Resumen  A la gripe tambin se la conoce como "influenza". Es una infeccin en los pulmones, la nariz y la garganta. Se transmite fcilmente de una persona a  otra.  Use los medicamentos de venta libre y los recetados solamente como se lo haya indicado el mdico.  Aplicarse la vacuna contra la gripe todos los aos es la mejor manera de no contagiarse la gripe. Esta informacin no tiene como fin reemplazar el consejo del mdico. Asegrese de hacerle al mdico cualquier pregunta que tenga. Document Revised: 07/25/2020 Document Reviewed: 07/25/2020 Elsevier Patient Education  2021 Elsevier Inc.  

## 2020-12-05 NOTE — Progress Notes (Signed)
Nina Hawkins is a 50 y.o. Hispanic female (interputor Malachi Bonds [id number] )  presents to office today for annual physical exam examination.  Approximately 2 to 3 weeks ago patient broke out in whelps and hives that were itchy and unable to get an appointment soon self treated with lemon and the welts went away.  Today she has petechiae on her back and under her breasts resolving yeast .  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 , Marital status: , Substance use: no Diet: no  Exercise: no  Last eye exam: never  Last dental exam: last year  Last colonoscopy:FOBT given today  Last mammogram: 4-5 years ago  Last pap smear: due  Refills needed today: no  Immunizations needed: Flu Vaccine: yes  Tdap Vaccine: no  - every 93yrs - (<3 lifetime doses or unknown): all wounds -- look up need for Tetanus IG - (>=3 lifetime doses): clean/minor wound if >43yrs from previous; all other wounds if >79yrs from previous Zoster Vaccine: no (those >50yo, once) Pneumonia Vaccine: no (those w/ risk factors) - (<25yr) Both: Immunocompromised, cochlear implant, CSF leak, asplenic, sickle cell, Chronic Renal Failure - (<44yr) PPSV-23 only: Heart dz, lung disease, DM, tobacco abuse, alcoholism, cirrhosis/liver disease. - (>61yr): PPSV13 then PPSV23 in 6-12mths;  - (>32yr): repeat PPSV23 once if pt received prior to 50yo and 30yrs have passed  No past medical history on file. Social History   Socioeconomic History  . Marital status: Single    Spouse name: Not on file  . Number of children: Not on file  . Years of education: Not on file  . Highest education level: Not on file  Occupational History  . Not on file  Tobacco Use  . Smoking status: Never Smoker  . Smokeless tobacco: Never Used  Substance and Sexual Activity  . Alcohol use: Yes  . Drug use: No  . Sexual activity: Yes  Other Topics Concern  . Not on file  Social History Narrative  . Not on file   Social Determinants of Health   Financial  Resource Strain: Not on file  Food Insecurity: Not on file  Transportation Needs: Not on file  Physical Activity: Not on file  Stress: Not on file  Social Connections: Not on file  Intimate Partner Violence: Not on file   Past Surgical History:  Procedure Laterality Date  . ABDOMINAL HYSTERECTOMY     No family history on file.  Current Outpatient Medications:  .  omeprazole (PRILOSEC) 20 MG capsule, Take 1 capsule (20 mg total) by mouth daily. (Patient not taking: Reported on 12/05/2020), Disp: 30 capsule, Rfl: 1 .  polyethylene glycol powder (GLYCOLAX/MIRALAX) 17 GM/SCOOP powder, Take 17 g by mouth 2 (two) times daily as needed. (Patient not taking: Reported on 12/05/2020), Disp: 3350 g, Rfl: 1  No Known Allergies   ROS: Review of Systems Pertinent items noted in HPI and remainder of comprehensive ROS otherwise negative.    Physical exam Vitals:   12/05/20 1509  BP: 124/89  Pulse: 87  Temp: (!) 95.2 F (35.1 C)  TempSrc: Temporal  SpO2: 92%  Weight: 186 lb 9.6 oz (84.6 kg)  Height: 4\' 9"  (1.448 m)   General: Vital signs reviewed.  Patient is well-developed and well-nourished, obese female in no acute distress and cooperative with exam.  Head: Normocephalic and atraumatic. Eyes: EOMI, conjunctivae normal, no scleral icterus.  Neck: Supple, trachea midline, normal ROM, no JVD, masses, thyromegaly, or carotid bruit present.  Cardiovascular: RRR, S1 normal, S2 normal, no  murmurs, gallops, or rubs. Pulmonary/Chest: Clear to auscultation bilaterally, no wheezes, rales, or rhonchi. Abdominal: Soft, non-tender, non-distended, BS +, no masses, organomegaly, or guarding present.  Musculoskeletal: No joint deformities, erythema, or stiffness, ROM full and nontender. Extremities: No lower extremity edema bilaterally,  pulses symmetric and intact bilaterally. No cyanosis or clubbing. Neurological: A&O x3, Strength is normal and symmetric bilaterally, cranial nerve II-XII are grossly  intact, no focal motor deficit, sensory intact to light touch bilaterally.  Skin: Warm, dry and intact. No rashes or erythema. Psychiatric: Normal mood and affect. speech and behavior is normal. Cognition and memory are normal.    Assessment/ Plan: Nina Hawkins here for annual physical exam.  Nina Hawkins was seen today for annual exam  Colon cancer screening -     Fecal occult blood, imunochemical; Future  Irritant contact dermatitis, unspecified trigger -     hydrOXYzine (ATARAX/VISTARIL) 10 MG tablet; Take 1 tablet (10 mg total) by mouth 3 (three) times daily as needed.  Encounter for screening mammogram for malignant neoplasm of breast Patient completed application for BCCP while in clinic and application has and faxed to [facility]. Patient aware that [facility] will contact her directly to schedule appointment.  Cervical cancer screening Patient completed application for BCCP while in clinic and application has and faxed to [facility]. Patient aware that [facility] will contact her directly to schedule appointment.   Counseled on healthy lifestyle choices, including diet (rich in fruits, vegetables and lean meats and low in salt and simple carbohydrates) and exercise (at least 30 minutes of moderate physical activity daily).  Patient to follow up in 1 year for annual exam or sooner if needed.  The above assessment and management plan was discussed with the patient. The patient verbalized understanding of and has agreed to the management plan. Patient is aware to call the clinic if symptoms persist or worsen. Patient is aware when to return to the clinic for a follow-up visit. Patient educated on when it is appropriate to go to the emergency department.   [name] [facility]   [address] [place], [place]. [address]

## 2020-12-20 ENCOUNTER — Ambulatory Visit (INDEPENDENT_AMBULATORY_CARE_PROVIDER_SITE_OTHER): Payer: Self-pay | Admitting: Primary Care

## 2020-12-23 ENCOUNTER — Other Ambulatory Visit: Payer: Self-pay | Admitting: Obstetrics and Gynecology

## 2020-12-23 ENCOUNTER — Other Ambulatory Visit: Payer: Self-pay | Admitting: Primary Care

## 2020-12-23 DIAGNOSIS — Z1231 Encounter for screening mammogram for malignant neoplasm of breast: Secondary | ICD-10-CM

## 2021-01-16 ENCOUNTER — Ambulatory Visit
Admission: RE | Admit: 2021-01-16 | Discharge: 2021-01-16 | Disposition: A | Discharge status: Home / Self Care | Payer: Self-pay | Source: Ambulatory Visit | Attending: Primary Care | Admitting: Primary Care

## 2021-01-16 ENCOUNTER — Other Ambulatory Visit: Payer: Self-pay

## 2021-01-16 DIAGNOSIS — Z1231 Encounter for screening mammogram for malignant neoplasm of breast: Secondary | ICD-10-CM

## 2021-01-22 ENCOUNTER — Other Ambulatory Visit: Payer: Self-pay

## 2021-01-22 ENCOUNTER — Ambulatory Visit (INDEPENDENT_AMBULATORY_CARE_PROVIDER_SITE_OTHER): Payer: Self-pay | Admitting: Primary Care

## 2021-01-22 ENCOUNTER — Encounter (INDEPENDENT_AMBULATORY_CARE_PROVIDER_SITE_OTHER): Payer: Self-pay | Admitting: Primary Care

## 2021-01-22 VITALS — BP 110/72 | HR 89 | Temp 97.3°F | Ht <= 58 in | Wt 189.2 lb

## 2021-01-22 DIAGNOSIS — L299 Pruritus, unspecified: Secondary | ICD-10-CM

## 2021-01-22 DIAGNOSIS — R7303 Prediabetes: Secondary | ICD-10-CM

## 2021-01-22 LAB — POCT GLYCOSYLATED HEMOGLOBIN (HGB A1C): Hemoglobin A1C: 6.4 % — AB (ref 4.0–5.6)

## 2021-01-22 MED ORDER — HYDROXYZINE HCL 10 MG PO TABS
ORAL_TABLET | Freq: Three times a day (TID) | ORAL | 1 refills | Status: AC | PRN
  Filled 2021-01-22 – 2021-01-30 (×2): qty 90, 30d supply, fill #0

## 2021-01-22 NOTE — Patient Instructions (Signed)
Diabetes Care, 44(Suppl 1), K93-O67. https://[email or web address]/https://doi.org/10.2337/dc21-[email or web address]">  Prediabetes Prediabetes La prediabetes es la afeccin en la que se tiene un nivel de azcar en la sangre (glucemia) ms alto de lo normal, aunque no lo suficientemente alto para recibir un diagnstico de diabetes tipo2. El hecho de ser prediabtico lo pone en riesgo de desarrollar diabetes tipo 2 (diabetes mellitus tipo2). Con ciertos cambios en el estilo de vida, usted puede prevenir o retrasar el comienzo de la diabetes tipo 2. Esto es importante porque la diabetes tipo2 puede provocar complicaciones graves, como:  Enfermedad cardaca.  Accidente cerebrovascular.  Ceguera.  Enfermedad renal.  Depresin.  Mala circulacin en los pies y las piernas. En casos graves, esto podra llevar a la extirpacin [place] de una pierna (amputacin). Cules son las causas? Se desconoce la causa exacta de la prediabetes. Puede deberse a la resistencia a la insulina. La resistencia a la insulina se presenta cuando las clulas del cuerpo no responden de [place] a la insulina que el [place]. Esto puede hacer que el exceso de glucosa se acumule en la sangre. Puede desarrollarse glucemia alta (hiperglucemia). Qu incrementa el riesgo? Los siguientes factores pueden hacer que sea ms propenso a Aeronautical engineer afeccin:  Tiene un familiar con diabetes tipo2.  Tiene ms de 45 aos.  Tuvo una forma temporal de diabetes que aparece durante el embarazo (diabetes gestacional).  Tuvo sndrome del ovario poliqustico (SOP).  Tiene sobrepeso o es obeso.  Lleva a una vida inactiva (sedentaria).  Tiene antecedentes de enfermedad cardaca, incluidos problemas con los niveles de [place], niveles altos de grasas en la sangre o presin arterial alta. Cules son los signos o sntomas? Puede ser asintomtico. Si tiene sntomas, pueden incluir los siguientes:  Aumento del apetito.  Aumento de  la sed.  Aumento de la cantidad [place].  Cambios en la visin, como visin borrosa.  Cansancio (fatiga). Cmo se diagnostica? Esta afeccin se puede diagnosticar con [employer]. La glucemia puede comprobarse con una o ms de las siguientes pruebas:  Prueba de la glucemia en ayunas. No se le permitir comer (tendr que [employer]) por al menos 8horas antes de que se le tome una [place] de [place].  Anlisis de sangre de A1c (hemoglobina A1c). Este anlisis proporciona informacin sobre los niveles glucemia durante los ltimos 2 o .  Prueba de tolerancia a la glucosa oral (PTGO). Esta prueba mide la glucemia en dos puntos temporales: ? Despus de ayunar. Este es el valor inicial. ? Dos horas despus de tomar una bebida que contiene glucosa. Pueden diagnosticarle prediabetes en los siguientes casos:  Si su glucemia en ayunas es de 100 a 125 mg/dl (5.6 a 6.9 mmol/l).  Si su nivel de A1c es de 5.7 a 6.4% (39 a 46 mmol/mol).  Si su resultado de la PTGO es de 140 a 199mg /dl (7.8 a ). Estos anlisis de [place] se pueden repetir para [place].   Cmo se trata? El tratamiento puede incluir cambios en la dieta y en el estilo de vida para ayudar a reducir la glucemia y prevenir el desarrollo de diabetes tipo 2. En algunos casos, pueden recetarle medicamentos para ayudarlo a reducir el riesgo de tener diabetes tipo2. Siga estas instrucciones en su casa: Nutricin  Siga un plan de alimentacin saludable. Esto incluye consumir protenas magras, cereales integrales, legumbres, frutas y verduras frescas, lcteos bajos en grasas y grasas saludables.  Siga las indicaciones del mdico respecto de las restricciones en las comidas o las bebidas.  Renase con un nutricionista para crear un plan de alimentacin saludable que sea adecuado para usted.   Estilo de vida  Haga ejercicio de [employer], al menos, 30 minutos por da, 5 das por  semana o ms, o segn lo indicado por su mdico. Una combinacin de actividades puede ser lo mejor, como: ? Caminata a paso rpido, natacin, ciclismo y levantamiento de pesas.  Baje de [employer] se lo haya indicado el mdico. Bajar entre el 5% y el 7% del peso corporal puede revertir la resistencia a la insulina.  No beba alcohol si: ? Su mdico le indica no hacerlo. ? Est embarazada, puede estar embarazada o est tratando de quedar embarazada.  Si bebe alcohol: ? Limite la cantidad que bebe:  De 0 a 1 medida por da para las mujeres.  De 0 a 2 medidas por da para los hombres. ? Est atento a la cantidad de alcohol que hay en las bebidas que toma. En los [place], una medida equivale a una botella de cerveza de 12oz ( ), un vaso de vino de 5oz ( ) o un vaso de una bebida alcohlica de alta graduacin de 1oz (78ml). Indicaciones generales  Use los medicamentos de venta libre y los recetados solamente como se lo haya indicado el mdico. Posiblemente le receten medicamentos para ayudarlo a reducir el riesgo de tener diabetes tipo2.  No consuma ningn producto que contenga nicotina o tabaco, como cigarrillos, cigarrillos electrnicos y tabaco de Theatre manager. Si necesita ayuda para dejar de consumir estos productos, consulte al [employer].  Cumpla con todas las visitas de seguimiento. Esto es importante. Dnde buscar ms informacin  American Diabetes Association (Asociacin Estadounidense de la Diabetes): www.diabetes.org  Academy of Nutrition and Dietetics (Academia de Nutricin y Pension scheme manager): www.eatright.org  American Heart Association (Asociacin Estadounidense del [place]n): www.heart.org Comunquese con un mdico si:  Tiene alguno de estos sntomas: ? Aumento del apetito. ? Aumento de la cantidad [place]. ? Aumento de la sed. ? Fatiga. ? Cambios en la visin, como visin borrosa. Busque ayuda de inmediato si:  Le falta el aire.  Se siente  confundido.  Vomita o tiene nuseas. Resumen  La prediabetes es la afeccin en la que se tiene un nivel de azcar en la sangre (glucemia)ms alto de lo normal, aunque no lo suficientemente alto para recibir un diagnstico de diabetes tipo2.  El hecho de ser prediabtico lo pone en riesgo de desarrollar diabetes tipo 2 (diabetes mellitus tipo2).  Haga cambios en su estilo de vida, como seguir una dieta saludable y hacer ejercicio regularmente, para ayudar a prevenir la diabetes. Baje de [employer] se lo haya indicado el mdico. Esta informacin no tiene Theme park manager el consejo del mdico. Asegrese de hacerle al mdico cualquier pregunta que tenga. Document Revised: 03/25/2020 Document Reviewed: 03/25/2020 Elsevier Patient Education  2021 [employer].

## 2021-01-22 NOTE — Progress Notes (Signed)
Established Patient Office Visit  Subjective:  Patient ID: Nina Hawkins, female    DOB: Feb 26, 1971  Age: 50 y.o. MRN: [id number]  CC:  Chief Complaint  Patient presents with  . check up    HPI Ms. Keona Jacinto Reap is a Hispanic female (interputor Wynetta Fines [id number])  presents for routine follow up. She voices itching all over she denies changes in deodorants, laundry detergents , lotions or foods.  She has no other complaints or concerns.  History reviewed. No pertinent past medical history.  Past Surgical History:  Procedure Laterality Date  . ABDOMINAL HYSTERECTOMY      History reviewed. No pertinent family history.  Social History   Socioeconomic History  . Marital status: Single    Spouse name: Not on file  . Number of children: Not on file  . Years of education: Not on file  . Highest education level: Not on file  Occupational History  . Not on file  Tobacco Use  . Smoking status: Never Smoker  . Smokeless tobacco: Never Used  Substance and Sexual Activity  . Alcohol use: Yes  . Drug use: No  . Sexual activity: Yes  Other Topics Concern  . Not on file  Social History Narrative  . Not on file   Social Determinants of Health   Financial Resource Strain: Not on file  Food Insecurity: Not on file  Transportation Needs: Not on file  Physical Activity: Not on file  Stress: Not on file  Social Connections: Not on file  Intimate Partner Violence: Not on file    Outpatient Medications Prior to Visit  Medication Sig Dispense Refill  . hydrOXYzine (ATARAX/VISTARIL) 10 MG tablet Take 1 tablet (10 mg total) by mouth 3 (three) times daily as needed. 30 tablet 0  . hydrOXYzine (ATARAX/VISTARIL) 10 MG tablet TAKE 1 TABLET (10 MG TOTAL) BY MOUTH 3 (THREE) TIMES DAILY AS NEEDED. 30 tablet 0  . polyethylene glycol powder (GLYCOLAX/MIRALAX) 17 GM/SCOOP powder Take 17 g by mouth 2 (two) times daily as needed. (Patient not taking: No sig reported) 3350 g 1  .  omeprazole (PRILOSEC) 20 MG capsule Take 1 capsule (20 mg total) by mouth daily. (Patient not taking: No sig reported) 30 capsule 1   No facility-administered medications prior to visit.    No Known Allergies  ROS Review of Systems  Skin:       Itching all over   All other systems reviewed and are negative.     Objective:    Physical Exam Vitals reviewed.  Constitutional:      Appearance: She is obese.     Comments: morbid  HENT:     Head: Normocephalic.     Right Ear: External ear normal.     Left Ear: External ear normal.     Nose: Nose normal.  Eyes:     Extraocular Movements: Extraocular movements intact.     Pupils: Pupils are equal, round, and reactive to light.  Cardiovascular:     Rate and Rhythm: Normal rate and regular rhythm.  Pulmonary:     Effort: Pulmonary effort is normal.     Breath sounds: Normal breath sounds.  Abdominal:     General: Bowel sounds are normal. There is distension.     Palpations: Abdomen is soft.  Musculoskeletal:        General: Normal range of motion.     Cervical back: Normal range of motion and neck supple.  Skin:    General:  Skin is warm and dry.  Neurological:     Mental Status: She is alert and oriented to person, place, and time.  Psychiatric:        Mood and Affect: Mood normal.        Behavior: Behavior normal.        Thought Content: Thought content normal.        Judgment: Judgment normal.     BP 110/72 (BP Location: Left Arm, Patient Position: Sitting, Cuff Size: Large)   Pulse 89   Temp (!) 97.3 F (36.3 C) (Temporal)   Ht _0  (1.448 m)   Wt 189 lb 3.2 oz (85.8 kg)   SpO2 95%   BMI 40.94 kg/m  Wt Readings from Last 3 Encounters:  01/31/21 185 lb 9.6 oz (84.2 kg)  01/22/21 189 lb 3.2 oz (85.8 kg)  12/05/20 186 lb 9.6 oz (84.6 kg)     Health Maintenance Due  Topic Date Due  . Hepatitis C Screening  Never done  . COVID-19 Vaccine (1) Never done  . COLONOSCOPY (Pts 45-1yr Insurance coverage will  need to be confirmed)  Never done  . PAP SMEAR-Modifier  05/21/2020    There are no preventive care reminders to display for this patient.  Lab Results  Component Value Date   TSH 4.000 12/25/2016   Lab Results  Component Value Date   WBC 6.6 01/31/2021   HGB 14.5 01/31/2021   HCT 43.6 01/31/2021   MCV 82 01/31/2021   PLT 273 01/31/2021   Lab Results  Component Value Date   NA 141 01/31/2021   K 4.3 01/31/2021   CO2 23 01/31/2021   GLUCOSE 99 01/31/2021   BUN 9 01/31/2021   CREATININE 0.70 01/31/2021   BILITOT 0.5 01/31/2021   ALKPHOS 83 01/31/2021   AST 16 01/31/2021   ALT 20 01/31/2021   PROT 7.1 01/31/2021   ALBUMIN 4.5 01/31/2021   CALCIUM 9.4 01/31/2021   ANIONGAP 11 12/24/2017   EGFR 106 01/31/2021   Lab Results  Component Value Date   CHOL 187 01/31/2021   Lab Results  Component Value Date   HDL 54 01/31/2021   Lab Results  Component Value Date   LDLCALC 113 (H) 01/31/2021   Lab Results  Component Value Date   TRIG 114 01/31/2021   Lab Results  Component Value Date   CHOLHDL 3.5 01/31/2021   Lab Results  Component Value Date   HGBA1C 6.4 (A) 01/22/2021      Assessment & Plan:   ADainewas seen today for check up.  Diagnoses and all orders for this visit:  Prediabetes -     HgB A1c 6.4 Per ADA guidelines 5.7-6-4 dx prediabetes  Recommendations  Decrease  Meat - red/white, Poultry and Dairy/especially cheese, monitoring carbs - rice , potatoes, breads, sugars, pasta  and Exercise at least 5 times a week for 30 minutes or preferably daily.   Pruritus -     hydrOXYzine (ATARAX/VISTARIL) 10 MG tablet; TAKE 1 TABLET (10 MG TOTAL) BY MOUTH 3 (THREE) TIMES DAILY AS NEEDED.    Meds ordered this encounter  Medications  . hydrOXYzine (ATARAX/VISTARIL) 10 MG tablet    Sig: TAKE 1 TABLET (10 MG TOTAL) BY MOUTH 3 (THREE) TIMES DAILY AS NEEDED.    Dispense:  90 tablet    Refill:  1    Follow-up: Return for pap and fasting labs.    MKerin Perna NP

## 2021-01-23 ENCOUNTER — Other Ambulatory Visit: Payer: Self-pay

## 2021-01-30 ENCOUNTER — Other Ambulatory Visit: Payer: Self-pay

## 2021-01-31 ENCOUNTER — Other Ambulatory Visit (INDEPENDENT_AMBULATORY_CARE_PROVIDER_SITE_OTHER): Payer: Self-pay | Admitting: Primary Care

## 2021-01-31 ENCOUNTER — Other Ambulatory Visit: Payer: Self-pay

## 2021-01-31 ENCOUNTER — Encounter (INDEPENDENT_AMBULATORY_CARE_PROVIDER_SITE_OTHER): Payer: Self-pay | Admitting: Primary Care

## 2021-01-31 ENCOUNTER — Ambulatory Visit (INDEPENDENT_AMBULATORY_CARE_PROVIDER_SITE_OTHER): Payer: Self-pay | Admitting: Primary Care

## 2021-01-31 VITALS — BP 126/88 | HR 82 | Temp 97.5°F | Ht <= 58 in | Wt 185.6 lb

## 2021-01-31 DIAGNOSIS — R7303 Prediabetes: Secondary | ICD-10-CM

## 2021-01-31 DIAGNOSIS — Z13220 Encounter for screening for lipoid disorders: Secondary | ICD-10-CM

## 2021-01-31 DIAGNOSIS — E7841 Elevated Lipoprotein(a): Secondary | ICD-10-CM

## 2021-01-31 DIAGNOSIS — R1084 Generalized abdominal pain: Secondary | ICD-10-CM

## 2021-01-31 DIAGNOSIS — Z124 Encounter for screening for malignant neoplasm of cervix: Secondary | ICD-10-CM

## 2021-01-31 MED ORDER — OMEPRAZOLE 20 MG PO CPDR
20.0000 mg | DELAYED_RELEASE_CAPSULE | Freq: Every day | ORAL | 1 refills | Status: DC
  Filled 2021-01-31: qty 30, 30d supply, fill #0

## 2021-01-31 NOTE — Telephone Encounter (Signed)
Requested Prescriptions  Pending Prescriptions Disp Refills  . omeprazole (PRILOSEC) 20 MG capsule 30 capsule 1    Sig: Take 1 capsule (20 mg total) by mouth daily.     Gastroenterology: Proton Pump Inhibitors Passed - 01/31/2021 11:32 AM      Passed - Valid encounter within last 12 months    Recent Outpatient Visits          Today Elevated lipoprotein(a)   [facility] Grayce Sessions, NP   1 week ago Prediabetes   [facility] Grayce Sessions, NP   1 month ago Colon cancer screening   [facility] Grayce Sessions, NP   1 year ago Oral lesion   [facility] Grayce Sessions, NP   1 year ago Annual physical exam   [facility] Grayce Sessions, NP

## 2021-01-31 NOTE — Progress Notes (Signed)
[facility]  Subjective:     Nina Hawkins is a 50 y.o. female and is here for a comprehensive physical exam. The patient reports no problems.  Social History   Socioeconomic History  . Marital status: Single    Spouse name: Not on file  . Number of children: Not on file  . Years of education: Not on file  . Highest education level: Not on file  Occupational History  . Not on file  Tobacco Use  . Smoking status: Never Smoker  . Smokeless tobacco: Never Used  Substance and Sexual Activity  . Alcohol use: Yes  . Drug use: No  . Sexual activity: Yes  Other Topics Concern  . Not on file  Social History Narrative  . Not on file   Social Determinants of Health   Financial Resource Strain: Not on file  Food Insecurity: Not on file  Transportation Needs: Not on file  Physical Activity: Not on file  Stress: Not on file  Social Connections: Not on file  Intimate Partner Violence: Not on file   Health Maintenance  Topic Date Due  . Hepatitis C Screening  Never done  . COVID-19 Vaccine (1) Never done  . COLONOSCOPY (Pts 45-66yr Insurance coverage will need to be confirmed)  Never done  . PAP SMEAR-Modifier  05/21/2020  . INFLUENZA VACCINE  05/12/2021  . TETANUS/TDAP  02/03/2027  . HIV Screening  Completed  . HPV VACCINES  Aged Out    The following portions of the patient's history were reviewed and updated as appropriate: allergies, past family history, past medical history, past social history and past surgical history.  Review of Systems A comprehensive review of systems was negative.   Objective:     BP 126/88 (BP Location: Right Arm, Patient Position: Sitting, Cuff Size: Large)   Pulse 82   Temp (!) 97.5 F (36.4 C) (Temporal)   Ht _0  (1.448 m)   Wt 185 lb 9.6 oz (84.2 kg)   SpO2 96%   BMI 40.16 kg/m   Assessment:    Healthy female exam.   CONSTITUTIONAL: Well-developed, well-nourished morbid obesity female in no acute distress.   HENT:  Normocephalic, atraumatic, External right and left ear normal. EYES: Conjunctivae and EOM are normal. Pupils are equal, round, and reactive to light.  NECK: Normal range of motion, supple,thick .  Normal thyroid.  SKIN: Skin is warm and dry. No rash noted. Not diaphoretic. No erythema. No pallor. [place] Alert and oriented to person, place, and time.  PSYCHIATRIC: Normal mood and affect. Normal behavior. Normal judgment and thought content. CARDIOVASCULAR: Normal heart rate noted, regular rhythm RESPIRATORY: Clear to auscultation bilaterally. Effort and breath sounds normal, no problems with respiration noted. BREASTS: Symmetric in size. No masses, skin changes, nipple drainage, or lymphadenopathy. Taught SBE patient demonstrated  ABDOMEN: Soft, normal bowel sounds, no distention noted.  No tenderness, rebound or guarding.  PELVIC: Normal appearing external genitalia; normal appearing vaginal mucosa and cervix.  No abnormal discharge noted.  Pap smear obtained.  Normal uterine size, no other palpable masses, no uterine or adnexal tenderness. MUSCULOSKELETAL: Normal range of motion. No tenderness.  No cyanosis, clubbing, or edema.  2+ distal pulses. Plan:     ASerinwas seen today for gynecologic exam.  Diagnoses and all orders for this visit:  Prediabetes A1C 6.4 increased from 5.6 2 years ago . She understand 5.7-6.4 is Prediabetes anything over is diabetes agreement of tx lifestyle modifications to include marijuana trying  carbohydrates-sugars, rice, tortillas, potatoes, breads and soft drinks -     CBC -     CMP14+EGFR  Cervical cancer screening -     Cytology - PAP ()  Screening for lipoid disorders Hx  of no medication at this time -     Lipid Panel   Juluis Mire, NP

## 2021-02-01 LAB — CMP14+EGFR
ALT: 20 IU/L (ref 0–32)
AST: 16 IU/L (ref 0–40)
Albumin/Globulin Ratio: 1.7 (ref 1.2–2.2)
Albumin: 4.5 g/dL (ref 3.8–4.8)
Alkaline Phosphatase: 83 IU/L (ref 44–121)
BUN/Creatinine Ratio: 13 (ref 9–23)
BUN: 9 mg/dL (ref 6–24)
Bilirubin Total: 0.5 mg/dL (ref 0.0–1.2)
CO2: 23 mmol/L (ref 20–29)
Calcium: 9.4 mg/dL (ref 8.7–10.2)
Chloride: 100 mmol/L (ref 96–106)
Creatinine, Ser: 0.7 mg/dL (ref 0.57–1.00)
Globulin, Total: 2.6 g/dL (ref 1.5–4.5)
Glucose: 99 mg/dL (ref 65–99)
Potassium: 4.3 mmol/L (ref 3.5–5.2)
Sodium: 141 mmol/L (ref 134–144)
Total Protein: 7.1 g/dL (ref 6.0–8.5)
eGFR: 106 mL/min/{1.73_m2} (ref >59)

## 2021-02-01 LAB — CBC
Hematocrit: 43.6 % (ref 34.0–46.6)
Hemoglobin: 14.5 g/dL (ref 11.1–15.9)
MCH: 27.3 pg (ref 26.6–33.0)
MCHC: 33.3 g/dL (ref 31.5–35.7)
MCV: 82 fL (ref 79–97)
Platelets: 273 10*3/uL (ref 150–450)
RBC: 5.31 x10E6/uL — ABNORMAL HIGH (ref 3.77–5.28)
RDW: 13.2 % (ref 11.7–15.4)
WBC: 6.6 10*3/uL (ref 3.4–10.8)

## 2021-02-01 LAB — LIPID PANEL
Chol/HDL Ratio: 3.5 ratio (ref 0.0–4.4)
Cholesterol, Total: 187 mg/dL (ref 100–199)
HDL: 54 mg/dL (ref >39)
LDL Chol Calc (NIH): 113 mg/dL — ABNORMAL HIGH (ref 0–99)
Triglycerides: 114 mg/dL (ref 0–149)
VLDL Cholesterol Cal: 20 mg/dL (ref 5–40)

## 2021-02-03 ENCOUNTER — Other Ambulatory Visit (INDEPENDENT_AMBULATORY_CARE_PROVIDER_SITE_OTHER): Payer: Self-pay | Admitting: Primary Care

## 2021-02-03 ENCOUNTER — Other Ambulatory Visit (HOSPITAL_COMMUNITY)
Admission: RE | Admit: 2021-02-03 | Discharge: 2021-02-03 | Disposition: A | Discharge status: Home / Self Care | Payer: No Typology Code available for payment source | Source: Ambulatory Visit | Attending: Primary Care | Admitting: Primary Care

## 2021-02-03 DIAGNOSIS — Z113 Encounter for screening for infections with a predominantly sexual mode of transmission: Secondary | ICD-10-CM

## 2021-02-03 DIAGNOSIS — Z124 Encounter for screening for malignant neoplasm of cervix: Secondary | ICD-10-CM | POA: Insufficient documentation

## 2021-02-04 LAB — CERVICOVAGINAL ANCILLARY ONLY
Bacterial Vaginitis (gardnerella): NEGATIVE
Candida Glabrata: NEGATIVE
Candida Vaginitis: NEGATIVE
Chlamydia: NEGATIVE
Comment: NEGATIVE
Comment: NEGATIVE
Comment: NEGATIVE
Comment: NEGATIVE
Comment: NEGATIVE
Comment: NORMAL
Neisseria Gonorrhea: NEGATIVE
Trichomonas: NEGATIVE

## 2021-02-04 LAB — CYTOLOGY - PAP
Adequacy: ABSENT
Diagnosis: NEGATIVE

## 2021-02-07 ENCOUNTER — Other Ambulatory Visit: Payer: Self-pay

## 2021-04-02 DIAGNOSIS — R7303 Prediabetes: Secondary | ICD-10-CM | POA: Insufficient documentation

## 2021-04-02 DIAGNOSIS — R718 Other abnormality of red blood cells: Secondary | ICD-10-CM | POA: Insufficient documentation

## 2021-04-02 DIAGNOSIS — L299 Pruritus, unspecified: Secondary | ICD-10-CM | POA: Insufficient documentation

## 2021-04-02 DIAGNOSIS — L249 Irritant contact dermatitis, unspecified cause: Secondary | ICD-10-CM | POA: Insufficient documentation

## 2021-04-02 DIAGNOSIS — R1084 Generalized abdominal pain: Secondary | ICD-10-CM | POA: Insufficient documentation

## 2022-02-28 ENCOUNTER — Ambulatory Visit (HOSPITAL_COMMUNITY)
Admission: EM | Admit: 2022-02-28 | Discharge: 2022-02-28 | Disposition: A | Discharge status: Home / Self Care | Payer: No Typology Code available for payment source | Attending: Physician Assistant | Admitting: Physician Assistant

## 2022-02-28 ENCOUNTER — Encounter (HOSPITAL_COMMUNITY): Payer: Self-pay | Admitting: *Deleted

## 2022-02-28 DIAGNOSIS — R509 Fever, unspecified: Secondary | ICD-10-CM | POA: Insufficient documentation

## 2022-02-28 DIAGNOSIS — J069 Acute upper respiratory infection, unspecified: Secondary | ICD-10-CM

## 2022-02-28 DIAGNOSIS — R519 Headache, unspecified: Secondary | ICD-10-CM | POA: Insufficient documentation

## 2022-02-28 DIAGNOSIS — Z20822 Contact with and (suspected) exposure to covid-19: Secondary | ICD-10-CM | POA: Insufficient documentation

## 2022-02-28 HISTORY — DX: Prediabetes: R73.03

## 2022-02-28 LAB — POC INFLUENZA A AND B ANTIGEN (URGENT CARE ONLY)
INFLUENZA A ANTIGEN, POC: NEGATIVE
INFLUENZA B ANTIGEN, POC: NEGATIVE

## 2022-02-28 MED ORDER — ACETAMINOPHEN 325 MG PO TABS
650.0000 mg | ORAL_TABLET | Freq: Once | ORAL | Status: AC
  Administered 2022-02-28: 650 mg via ORAL

## 2022-02-28 MED ORDER — PSEUDOEPH-BROMPHEN-DM 30-2-10 MG/5ML PO SYRP: 5.0000 mL | ORAL_SOLUTION | Freq: Four times a day (QID) | ORAL | 0 refills | Status: DC | PRN

## 2022-02-28 MED ORDER — ACETAMINOPHEN 325 MG PO TABS
ORAL_TABLET | ORAL | Status: AC
  Filled 2022-02-28: qty 2

## 2022-02-28 NOTE — Discharge Instructions (Signed)
Recommend Flonase and Mucinex Continue with ibuprofen as needed for headache and fever Rest, drink plenty of fluids Will call with test results Return if symptoms become worse.

## 2022-02-28 NOTE — ED Triage Notes (Addendum)
C/O tactile fevers and chills x 3 days. Per daughter, pt states she feels like she has a slight cough starting. Also c/o HA and body aches.Has taken IBU - last dose this AM.

## 2022-03-01 LAB — SARS CORONAVIRUS 2 (TAT 6-24 HRS): SARS Coronavirus 2: NEGATIVE

## 2022-03-02 LAB — POCT RAPID STREP A, ED / UC: Streptococcus, Group A Screen (Direct): NEGATIVE

## 2023-03-16 DIAGNOSIS — Z139 Encounter for screening, unspecified: Secondary | ICD-10-CM

## 2023-03-16 LAB — GLUCOSE, POCT (MANUAL RESULT ENTRY): POC Glucose: 113 mg/dl — AB (ref 70–99)

## 2023-03-16 NOTE — Congregational Nurse Program (Unsigned)
  Dept: [phone]   Congregational Nurse Program Note  Date of Encounter: 03/16/2023  Past Medical History: Past Medical History:  Diagnosis Date   Pre-diabetes     Encounter Details:  CNP Questionnaire - 03/16/23 1645       Questionnaire   Ask client: Do you give verbal consent for me to treat you today? Yes    Oncologist    Location Patient Served  [employer]    Visit Setting with Client Organization    Insurance Uninsured ([facility]/[facility] Connects/Self-Pay/Medicaid Family Planning)    Insurance/Financial Assistance Referral [employer] [facility]s    Medication N/A    Medical Provider No    Screening Referrals Made Annual Wellness Visit    Medical Referrals Made Non-[facility] PCP/Clinic    Medical Appointment Made N/A    Recently w/o PCP, now 1st time PCP visit completed due to CNs referral or appointment made N/A    Food N/A    Transportation N/A    Housing/Utilities N/A    Interpersonal Safety N/A    Interventions [facility];Case Management    Abnormal to Normal Screening Since Last CN Visit N/A    Screenings CN Performed Blood Glucose    Did client attend any of the following based off CNs referral or appointments made? N/A    ED Visit Averted N/A            Here for blood pressure  screening, blood glucose. Weight 183lb. Here for orange card application. Would like to establish with a PCP.

## 2023-06-11 ENCOUNTER — Ambulatory Visit (INDEPENDENT_AMBULATORY_CARE_PROVIDER_SITE_OTHER): Payer: Self-pay | Admitting: Nurse Practitioner

## 2023-06-11 ENCOUNTER — Encounter: Payer: Self-pay | Admitting: Nurse Practitioner

## 2023-06-11 VITALS — BP 126/79 | HR 90 | Resp 16 | Wt 177.0 lb

## 2023-06-11 DIAGNOSIS — Z1211 Encounter for screening for malignant neoplasm of colon: Secondary | ICD-10-CM | POA: Insufficient documentation

## 2023-06-11 DIAGNOSIS — R7303 Prediabetes: Secondary | ICD-10-CM

## 2023-06-11 DIAGNOSIS — Z13228 Encounter for screening for other metabolic disorders: Secondary | ICD-10-CM

## 2023-06-11 DIAGNOSIS — Z1321 Encounter for screening for nutritional disorder: Secondary | ICD-10-CM

## 2023-06-11 DIAGNOSIS — Z130 Encounter for screening for diseases of the blood and blood-forming organs and certain disorders involving the immune mechanism: Secondary | ICD-10-CM

## 2023-06-11 DIAGNOSIS — Z6838 Body mass index (BMI) 38.0-38.9, adult: Secondary | ICD-10-CM | POA: Insufficient documentation

## 2023-06-11 DIAGNOSIS — Z1329 Encounter for screening for other suspected endocrine disorder: Secondary | ICD-10-CM

## 2023-06-11 DIAGNOSIS — Z0000 Encounter for general adult medical examination without abnormal findings: Secondary | ICD-10-CM | POA: Insufficient documentation

## 2023-06-11 DIAGNOSIS — Z1231 Encounter for screening mammogram for malignant neoplasm of breast: Secondary | ICD-10-CM | POA: Insufficient documentation

## 2023-06-11 DIAGNOSIS — B372 Candidiasis of skin and nail: Secondary | ICD-10-CM | POA: Insufficient documentation

## 2023-06-11 MED ORDER — NYSTATIN 100000 UNIT/GM EX POWD
1.0000 | Freq: Three times a day (TID) | CUTANEOUS | 1 refills | Status: DC
  Filled 2023-06-11 – 2023-10-21 (×2): qty 15, 5d supply, fill #0

## 2023-06-11 NOTE — Assessment & Plan Note (Signed)
-   nystatin (MYCOSTATIN/NYSTOP) powder; Apply 1 Application topically 3 (three) times daily.  Dispense: 15 g; Refill: 1  Encouraged to keep skin clean and dry

## 2023-06-11 NOTE — Assessment & Plan Note (Signed)
Lab Results  Component Value Date   HGBA1C 6.4 (A) 01/22/2021  Avoid sugar sweets soda Checking A1c Encouraged to lose weight

## 2023-06-11 NOTE — Assessment & Plan Note (Signed)
Wt Readings from Last 3 Encounters:  06/11/23 177 lb (80.3 kg)  03/16/23 183 lb (83 kg)  01/31/21 185 lb 9.6 oz (84.2 kg)   Body mass index is 36.99 kg/m.  Patient counseled on low-carb modified diet Encouraged to engage in regular moderate to vigorous exercises at least 150 minutes weekly

## 2023-06-11 NOTE — Progress Notes (Signed)
New Patient Office Visit  Subjective:  Patient ID: Nina Hawkins, female    DOB: July 08, 1971  Age: 52 y.o. MRN: [id number]  CC:  Chief Complaint  Patient presents with   Establish Care    HPI Nina Hawkins is a 52 y.o. female  has a past medical history of Pre-diabetes, Prediabetes, and Uterine cancer (HCC).  Patient presents to establish care and for a CPE.  Patient Nina Alexanders, NP.  Stated that she had uterine cancer at age 59, has had her cervix and uterus removed.  Last Pap exam was negative.  Due for colon cancer screening Cologuard test ordered.  Due for mammogram scholarship application form for mammogram provided.  Patient is accompanied by a [place] interpreter who assisted with medical interpretation  She denies any concerns today    Past Medical History:  Diagnosis Date   Pre-diabetes    Prediabetes    Uterine cancer (HCC)     Past Surgical History:  Procedure Laterality Date   ABDOMINAL HYSTERECTOMY      Family History  Problem Relation Age of Onset   Osteoarthritis Mother    Heart disease Mother    Stroke Neg Hx     Social History   Socioeconomic History   Marital status: Single    Spouse name: Not on file   Number of children: 3   Years of education: Not on file   Highest education level: Not on file  Occupational History   Not on file  Tobacco Use   Smoking status: Never   Smokeless tobacco: Never  Vaping Use   Vaping status: Never Used  Substance and Sexual Activity   Alcohol use: Yes    Comment: occasionally   Drug use: No   Sexual activity: Not Currently    Birth control/protection: Surgical  Other Topics Concern   Not on file  Social History Narrative   Lives with her children    Social Determinants of Health   Financial Resource Strain: Not on file  Food Insecurity: Not on file  Transportation Needs: Not on file  Physical Activity: Not on file  Stress: Not on file  Social Connections: Not on file   Intimate Partner Violence: Not on file    ROS Review of Systems  Constitutional:  Negative for activity change, appetite change, chills, fatigue and fever.  HENT:  Negative for congestion, dental problem, ear discharge, ear pain, hearing loss, rhinorrhea, sinus pressure, sinus pain, sneezing and sore throat.   Eyes:  Negative for pain, discharge, redness and itching.  Respiratory:  Negative for cough, chest tightness, shortness of breath and wheezing.   Cardiovascular:  Negative for chest pain, palpitations and leg swelling.  Gastrointestinal:  Negative for abdominal distention, abdominal pain, anal bleeding, blood in stool, constipation, diarrhea, nausea, rectal pain and vomiting.  Endocrine: Negative for cold intolerance, heat intolerance, polydipsia, polyphagia and polyuria.  Genitourinary:  Negative for difficulty urinating, dysuria, flank pain, frequency, hematuria, menstrual problem, pelvic pain and vaginal bleeding.  Musculoskeletal:  Negative for arthralgias, back pain, gait problem, joint swelling and myalgias.  Skin:  Negative for color change, pallor, rash and wound.  Allergic/Immunologic: Negative for environmental allergies, food allergies and immunocompromised state.  Neurological:  Negative for dizziness, tremors, facial asymmetry, weakness and headaches.  Hematological:  Negative for adenopathy. Does not bruise/bleed easily.  Psychiatric/Behavioral:  Negative for agitation, behavioral problems, confusion, decreased concentration, hallucinations, self-injury and suicidal ideas.     Objective:   Today's Vitals: BP 126/79 (BP  Location: Right Arm, Patient Position: Sitting, Cuff Size: Large)   Pulse 90   Resp 16   Wt 177 lb (80.3 kg)   SpO2 98%   BMI 36.99 kg/m   Physical Exam Vitals and nursing note reviewed. Exam conducted with a chaperone present.  Constitutional:      General: She is not in acute distress.    Appearance: Normal appearance. She is obese. She is not  ill-appearing, toxic-appearing or diaphoretic.  HENT:     Right Ear: Tympanic membrane, ear canal and external ear normal. There is no impacted cerumen.     Left Ear: Tympanic membrane, ear canal and external ear normal. There is no impacted cerumen.     Nose: Nose normal. No congestion or rhinorrhea.     Mouth/Throat:     Mouth: Mucous membranes are moist.     Pharynx: Oropharynx is clear. No oropharyngeal exudate or posterior oropharyngeal erythema.  Eyes:     General: No scleral icterus.       Right eye: No discharge.        Left eye: No discharge.     Extraocular Movements: Extraocular movements intact.     Conjunctiva/sclera: Conjunctivae normal.  Neck:     Vascular: No carotid bruit.  Cardiovascular:     Rate and Rhythm: Normal rate and regular rhythm.     Pulses: Normal pulses.     Heart sounds: Normal heart sounds. No murmur heard.    No friction rub. No gallop.  Pulmonary:     Effort: Pulmonary effort is normal. No respiratory distress.     Breath sounds: Normal breath sounds. No stridor. No wheezing, rhonchi or rales.  Chest:     Chest wall: No mass, lacerations, deformity, swelling, tenderness, crepitus or edema. There is no dullness to percussion.  Breasts:    Tanner Score is 5.     Breasts are symmetrical.     Right: No swelling, bleeding, inverted nipple, mass, nipple discharge, skin change or tenderness.     Left: No swelling, bleeding, inverted nipple, mass, nipple discharge, skin change or tenderness.  Abdominal:     General: Bowel sounds are normal. There is no distension.     Palpations: Abdomen is soft. There is no mass.     Tenderness: There is no abdominal tenderness. There is no right CVA tenderness, left CVA tenderness, guarding or rebound.     Hernia: No hernia is present.  Musculoskeletal:        General: No swelling, tenderness, deformity or signs of injury.     Cervical back: Normal range of motion and neck supple. No rigidity or tenderness.      Right lower leg: No edema.     Left lower leg: No edema.  Lymphadenopathy:     Cervical: No cervical adenopathy.     Upper Body:     Right upper body: No supraclavicular, axillary or pectoral adenopathy.     Left upper body: No supraclavicular, axillary or pectoral adenopathy.  Skin:    General: Skin is warm and dry.     Capillary Refill: Capillary refill takes less than 2 seconds.     Coloration: Skin is not jaundiced or pale.     Findings: No bruising, erythema, lesion or rash.     Comments: Skin under both breast and below the abdomen moist and mildly red.  No rashes or discharge noted  Neurological:     Mental Status: She is alert and oriented to person, place, and  time.     Cranial Nerves: No cranial nerve deficit.     Sensory: No sensory deficit.     Motor: No weakness.     Coordination: Coordination normal.     Gait: Gait normal.     Deep Tendon Reflexes: Reflexes normal.  Psychiatric:        Mood and Affect: Mood normal.        Behavior: Behavior normal.        Thought Content: Thought content normal.        Judgment: Judgment normal.     Assessment & Plan:   Problem List Items Addressed This Visit       Musculoskeletal and Integument   Candidiasis, intertrigo     - nystatin (MYCOSTATIN/NYSTOP) powder; Apply 1 Application topically 3 (three) times daily.  Dispense: 15 g; Refill: 1  Encouraged to keep skin clean and dry      Relevant Medications   nystatin (MYCOSTATIN/NYSTOP) powder     Other   Prediabetes    Lab Results  Component Value Date   HGBA1C 6.4 (A) 01/22/2021  Avoid sugar sweets soda Checking A1c Encouraged to lose weight      Relevant Orders   Hemoglobin A1c   Screening mammogram for breast cancer   Relevant Orders   MM Digital Screening   Screening for colon cancer   Relevant Orders   Cologuard   Annual physical exam - Primary    Annual exam as documented.  Counseling done include healthy lifestyle involving committing to 150 minutes  of exercise per week, heart healthy diet, and attaining healthy weight. The importance of adequate sleep also discussed.  Regular use of seat belt and home safety were also discussed . Changes in health habits are decided on by patient with goals and time frames set for achieving them. Immunization and cancer screening  needs are specifically addressed at this visit.    Routine fasting labs ordered Mammogram and Cologuard ordered Encouraged to get shingles vaccine at the pharmacy      Class 2 severe obesity with serious comorbidity and body mass index (BMI) of 38.0 to 38.9 in adult [facility])    Wt Readings from Last 3 Encounters:  06/11/23 177 lb (80.3 kg)  03/16/23 183 lb (83 kg)  01/31/21 185 lb 9.6 oz (84.2 kg)   Body mass index is 36.99 kg/m.  Patient counseled on low-carb modified diet Encouraged to engage in regular moderate to vigorous exercises at least 150 minutes weekly      Other Visit Diagnoses     Screening for endocrine, nutritional, metabolic and immunity disorder       Relevant Orders   CBC   CMP14+EGFR   Hepatitis C antibody   Lipid panel       Outpatient Encounter Medications as of 06/11/2023  Medication Sig   nystatin (MYCOSTATIN/NYSTOP) powder Apply 1 Application topically 3 (three) times daily.   [DISCONTINUED] brompheniramine-pseudoephedrine-DM 30-2-10 MG/5ML syrup Take 5 mLs by mouth 4 (four) times daily as needed.   [DISCONTINUED] omeprazole (PRILOSEC) 20 MG capsule Take 1 capsule (20 mg total) by mouth daily.   [DISCONTINUED] polyethylene glycol powder (GLYCOLAX/MIRALAX) 17 GM/SCOOP powder Take 17 g by mouth 2 (two) times daily as needed. (Patient not taking: Reported on 12/05/2020)   No facility-administered encounter medications on file as of 06/11/2023.    Follow-up: Return in about 1 year (around 06/10/2024) for CPE, FASTING LABS THIS WEEK.   Donell Beers, FNP

## 2023-06-11 NOTE — Assessment & Plan Note (Signed)
Annual exam as documented.  Counseling done include healthy lifestyle involving committing to 150 minutes of exercise per week, heart healthy diet, and attaining healthy weight. The importance of adequate sleep also discussed.  Regular use of seat belt and home safety were also discussed . Changes in health habits are decided on by patient with goals and time frames set for achieving them. Immunization and cancer screening  needs are specifically addressed at this visit.    Routine fasting labs ordered Mammogram and Cologuard ordered Encouraged to get shingles vaccine at the pharmacy

## 2023-06-11 NOTE — Patient Instructions (Signed)
Please get your shingles vaccine at the pharmacy.   It is important that you exercise regularly at least 30 minutes 5 times a week as tolerated  Think about what you will eat, plan ahead. Choose " clean, green, fresh or frozen" over canned, processed or packaged foods which are more sugary, salty and fatty. 70 to 75% of food eaten should be vegetables and fruit. Three meals at set times with snacks allowed between meals, but they must be fruit or vegetables. Aim to eat over a 12 hour period , example 7 am to 7 pm, and STOP after  your last meal of the day. Drink water,generally about 64 ounces per day, no other drink is as healthy. Fruit juice is best enjoyed in a healthy way, by EATING the fruit.  Thanks for choosing [facility] we consider it a privelige to serve you.  

## 2023-06-15 ENCOUNTER — Other Ambulatory Visit: Payer: Self-pay

## 2023-06-17 ENCOUNTER — Other Ambulatory Visit: Payer: Self-pay

## 2023-06-17 DIAGNOSIS — R7303 Prediabetes: Secondary | ICD-10-CM

## 2023-06-17 DIAGNOSIS — Z13228 Encounter for screening for other metabolic disorders: Secondary | ICD-10-CM

## 2023-06-18 LAB — CBC
Hematocrit: 42.9 % (ref 34.0–46.6)
Hemoglobin: 13.9 g/dL (ref 11.1–15.9)
MCH: 28 pg (ref 26.6–33.0)
MCHC: 32.4 g/dL (ref 31.5–35.7)
MCV: 87 fL (ref 79–97)
Platelets: 273 10*3/uL (ref 150–450)
RBC: 4.96 x10E6/uL (ref 3.77–5.28)
RDW: 12.8 % (ref 11.7–15.4)
WBC: 7 10*3/uL (ref 3.4–10.8)

## 2023-06-18 LAB — CMP14+EGFR
ALT: 22 IU/L (ref 0–32)
AST: 18 IU/L (ref 0–40)
Albumin: 4.4 g/dL (ref 3.8–4.9)
Alkaline Phosphatase: 89 IU/L (ref 44–121)
BUN/Creatinine Ratio: 18 (ref 9–23)
BUN: 12 mg/dL (ref 6–24)
Bilirubin Total: 0.3 mg/dL (ref 0.0–1.2)
CO2: 23 mmol/L (ref 20–29)
Calcium: 9.4 mg/dL (ref 8.7–10.2)
Chloride: 101 mmol/L (ref 96–106)
Creatinine, Ser: 0.68 mg/dL (ref 0.57–1.00)
Globulin, Total: 2.4 g/dL (ref 1.5–4.5)
Glucose: 93 mg/dL (ref 70–99)
Potassium: 3.8 mmol/L (ref 3.5–5.2)
Sodium: 141 mmol/L (ref 134–144)
Total Protein: 6.8 g/dL (ref 6.0–8.5)
eGFR: 105 mL/min/{1.73_m2} (ref >59)

## 2023-06-18 LAB — LIPID PANEL
Chol/HDL Ratio: 2.7 ratio (ref 0.0–4.4)
Cholesterol, Total: 183 mg/dL (ref 100–199)
HDL: 67 mg/dL (ref >39)
LDL Chol Calc (NIH): 99 mg/dL (ref 0–99)
Triglycerides: 92 mg/dL (ref 0–149)
VLDL Cholesterol Cal: 17 mg/dL (ref 5–40)

## 2023-06-18 LAB — HEMOGLOBIN A1C
Est. average glucose Bld gHb Est-mCnc: 134 mg/dL
Hgb A1c MFr Bld: 6.3 % — ABNORMAL HIGH (ref 4.8–5.6)

## 2023-06-18 LAB — HEPATITIS C ANTIBODY: Hep C Virus Ab: NONREACTIVE

## 2023-06-22 ENCOUNTER — Other Ambulatory Visit: Payer: Self-pay

## 2023-06-24 ENCOUNTER — Telehealth: Payer: Self-pay | Admitting: Nurse Practitioner

## 2023-06-24 NOTE — Telephone Encounter (Signed)
Pt ask about her lab results?  Also asked about a cream or powder med that she was waiting on you to send her to the pharmacy

## 2023-06-30 NOTE — Telephone Encounter (Signed)
No answer lvm for pt to call back. Kh

## 2023-07-01 ENCOUNTER — Telehealth: Payer: Self-pay

## 2023-07-01 NOTE — Telephone Encounter (Signed)
Telephoned patient at home number using interpreter#[id number]. Left a voice message with [facility] (scholarship) contact information.

## 2023-07-10 NOTE — Telephone Encounter (Signed)
No answer lvm for pt to call back. KH

## 2023-08-26 LAB — COLOGUARD: COLOGUARD: NEGATIVE

## 2023-10-20 ENCOUNTER — Ambulatory Visit (INDEPENDENT_AMBULATORY_CARE_PROVIDER_SITE_OTHER): Payer: Self-pay | Admitting: Nurse Practitioner

## 2023-10-20 ENCOUNTER — Encounter: Payer: Self-pay | Admitting: Nurse Practitioner

## 2023-10-20 ENCOUNTER — Other Ambulatory Visit: Payer: Self-pay

## 2023-10-20 VITALS — BP 154/93 | HR 85 | Temp 97.6°F | Wt 179.0 lb

## 2023-10-20 DIAGNOSIS — R030 Elevated blood-pressure reading, without diagnosis of hypertension: Secondary | ICD-10-CM | POA: Insufficient documentation

## 2023-10-20 DIAGNOSIS — J029 Acute pharyngitis, unspecified: Secondary | ICD-10-CM | POA: Insufficient documentation

## 2023-10-20 DIAGNOSIS — I10 Essential (primary) hypertension: Secondary | ICD-10-CM | POA: Insufficient documentation

## 2023-10-20 DIAGNOSIS — J309 Allergic rhinitis, unspecified: Secondary | ICD-10-CM

## 2023-10-20 DIAGNOSIS — N393 Stress incontinence (female) (male): Secondary | ICD-10-CM | POA: Insufficient documentation

## 2023-10-20 LAB — POCT RAPID STREP A (OFFICE): Rapid Strep A Screen: NEGATIVE

## 2023-10-20 MED ORDER — FLUTICASONE PROPIONATE 50 MCG/ACT NA SUSP
2.0000 | Freq: Every day | NASAL | 6 refills | Status: DC
  Filled 2023-10-20: qty 16, 30d supply, fill #0

## 2023-10-20 NOTE — Patient Instructions (Signed)
 Around 3 times per week, check your blood pressure 2 times per day. once in the morning and once in the evening. The readings should be at least one minute apart. Write down these values and bring them to your next nurse visit/appointment.  When you check your BP, make sure you have been doing something calm/relaxing 5 minutes prior to checking. Both feet should be flat on the floor and you should be sitting. Use your left arm and make sure it is in a relaxed position (on a table), and that the cuff is at the approximate level/height of your heart.   Blood pressure goal is less than 140/90.  Please call the office if your blood pressure consistently stays greater than 140/90.  1. Allergic rhinitis, unspecified seasonality, unspecified trigger (Primary)  - fluticasone  (FLONASE ) 50 MCG/ACT nasal spray; Place 2 sprays into both nostrils daily.  Dispense: 16 g; Refill: 6  2. Sore throat    It is important that you exercise regularly at least 30 minutes 5 times a week as tolerated  Think about what you will eat, plan ahead. Choose  clean, green, fresh or frozen over canned, processed or packaged foods which are more sugary, salty and fatty. 70 to 75% of food eaten should be vegetables and fruit. Three meals at set times with snacks allowed between meals, but they must be fruit or vegetables. Aim to eat over a 12 hour period , example 7 am to 7 pm, and STOP after  your last meal of the day. Drink water,generally about 64 ounces per day, no other drink is as healthy. Fruit juice is best enjoyed in a healthy way, by EATING the fruit.  Thanks for choosing [facility] we consider it a privelige to serve you.

## 2023-10-20 NOTE — Assessment & Plan Note (Signed)
 BP Readings from Last 3 Encounters:  10/20/23 (!) 154/93  06/11/23 126/79  03/16/23 132/89  Blood pressure is elevated in the office today but has been previously well-controlled She denies chest pain, shortness of breath, edema Patient encouraged to monitor blood pressure at home and report blood pressure readings consistently greater than 140/90

## 2023-10-20 NOTE — Assessment & Plan Note (Addendum)
 Flonase nasal spray 2 spray into both nostrils daily ordered Patient encouraged to avoid allergens Continue Mucinex as needed for cough

## 2023-10-20 NOTE — Assessment & Plan Note (Signed)
 Negative for strep Patient encouraged to drink warm fluids and gargle throat with salt water

## 2023-10-20 NOTE — Progress Notes (Signed)
 Acute Office Visit  Subjective:     Patient ID: Nina Hawkins, female    DOB: [birth year], 53 y.o.   MRN: [id number]  Chief Complaint  Patient presents with   Cough    Itchy throat   Urinary Incontinence    Cough Associated symptoms include rhinorrhea and a sore throat. Pertinent negatives include no chest pain, chills, fever, headaches, myalgias, postnasal drip, rash, shortness of breath or wheezing.   Ms. Patch  has a past medical history of Pre-diabetes, Prediabetes, and Uterine cancer (HCC).  Patient presents with complaints of sneezing, itchy throat, runny nose for the past 2 months, also had a cold last week, she took Mucinex that helped her cough. she also reported sore throat  she denies fever, chills, chest pain, shortness of breath, headaches  Patient reports urinary incontinence whenever she coughs.  No complaints of dysuria, abdominal pain, nausea, vomiting.    Review of Systems  Constitutional:  Negative for appetite change, chills, fatigue and fever.  HENT:  Positive for rhinorrhea, sneezing and sore throat. Negative for congestion and postnasal drip.   Respiratory:  Positive for cough. Negative for shortness of breath and wheezing.   Cardiovascular:  Negative for chest pain, palpitations and leg swelling.  Gastrointestinal:  Negative for abdominal pain, constipation, nausea and vomiting.  Genitourinary:  Negative for difficulty urinating, dysuria, flank pain, frequency and hematuria.  Musculoskeletal:  Negative for arthralgias, back pain, joint swelling and myalgias.  Skin:  Negative for color change, pallor, rash and wound.  Neurological:  Negative for dizziness, facial asymmetry, weakness, numbness and headaches.  Psychiatric/Behavioral:  Negative for behavioral problems, confusion, self-injury and suicidal ideas.         Objective:    BP (!) 154/93   Pulse 85   Temp 97.6 F (36.4 C)   Wt 179 lb (81.2 kg)   SpO2 97%   BMI 37.41 kg/m     Physical Exam Vitals reviewed.  Constitutional:      General: She is not in acute distress.    Appearance: Normal appearance. She is not ill-appearing, toxic-appearing or diaphoretic.  HENT:     Right Ear: Tympanic membrane, ear canal and external ear normal. There is no impacted cerumen.     Left Ear: Tympanic membrane, ear canal and external ear normal. There is no impacted cerumen.     Nose: No congestion or rhinorrhea.     Right Turbinates: Enlarged.     Mouth/Throat:     Mouth: Mucous membranes are moist.     Pharynx: Oropharynx is clear. No oropharyngeal exudate or posterior oropharyngeal erythema.     Tonsils: No tonsillar exudate or tonsillar abscesses. 0 on the right. 0 on the left.  Eyes:     General: No scleral icterus.       Right eye: No discharge.        Left eye: No discharge.     Extraocular Movements: Extraocular movements intact.     Conjunctiva/sclera: Conjunctivae normal.  Cardiovascular:     Rate and Rhythm: Normal rate and regular rhythm.     Pulses: Normal pulses.     Heart sounds: Normal heart sounds. No murmur heard. Pulmonary:     Effort: Pulmonary effort is normal. No respiratory distress.     Breath sounds: Normal breath sounds. No stridor. No wheezing, rhonchi or rales.  Chest:     Chest wall: No tenderness.  Abdominal:     General: There is no distension.  Palpations: Abdomen is soft.     Tenderness: There is no abdominal tenderness. There is no guarding.  Musculoskeletal:        General: No tenderness.     Cervical back: Normal range of motion and neck supple. No rigidity or tenderness.     Right lower leg: No edema.     Left lower leg: No edema.  Lymphadenopathy:     Cervical: No cervical adenopathy.  Skin:    General: Skin is warm and dry.     Capillary Refill: Capillary refill takes less than 2 seconds.  Neurological:     Mental Status: She is alert and oriented to person, place, and time.     Motor: No weakness.      Coordination: Coordination normal.     Gait: Gait normal.  Psychiatric:        Mood and Affect: Mood normal.        Behavior: Behavior normal.        Thought Content: Thought content normal.        Judgment: Judgment normal.     Results for orders placed or performed in visit on 10/20/23  POCT rapid strep A  Result Value Ref Range   Rapid Strep A Screen Negative Negative        Assessment & Plan:   Problem List Items Addressed This Visit       Respiratory   Allergic rhinitis - Primary   Flonase  nasal spray 2 spray into both nostrils daily ordered Patient encouraged to avoid allergens Continue Mucinex as needed for cough      Relevant Medications   fluticasone  (FLONASE ) 50 MCG/ACT nasal spray     Other   Sore throat   Negative for strep Patient encouraged to drink warm fluids and gargle throat with salt water      Relevant Orders   POCT rapid strep A (Completed)   Stress incontinence of urine   Patient encouraged to do [place]egel regular exercises, educational material provided      Elevated BP without diagnosis of hypertension   BP Readings from Last 3 Encounters:  10/20/23 (!) 154/93  06/11/23 126/79  03/16/23 132/89  Blood pressure is elevated in the office today but has been previously well-controlled She denies chest pain, shortness of breath, edema Patient encouraged to monitor blood pressure at home and report blood pressure readings consistently greater than 140/90        Meds ordered this encounter  Medications   fluticasone  (FLONASE ) 50 MCG/ACT nasal spray    Sig: Place 2 sprays into both nostrils daily.    Dispense:  16 g    Refill:  6    No follow-ups on file.  Teondre Jarosz R Oland Arquette, FNP

## 2023-10-20 NOTE — Assessment & Plan Note (Signed)
 Patient encouraged to do Kegel regular exercises, educational material provided

## 2023-10-21 ENCOUNTER — Other Ambulatory Visit: Payer: Self-pay

## 2023-10-26 ENCOUNTER — Other Ambulatory Visit: Payer: Self-pay

## 2023-12-03 ENCOUNTER — Other Ambulatory Visit: Payer: Self-pay

## 2023-12-03 DIAGNOSIS — Z1231 Encounter for screening mammogram for malignant neoplasm of breast: Secondary | ICD-10-CM

## 2023-12-07 ENCOUNTER — Ambulatory Visit (INDEPENDENT_AMBULATORY_CARE_PROVIDER_SITE_OTHER): Payer: Self-pay | Admitting: Nurse Practitioner

## 2023-12-07 ENCOUNTER — Encounter: Payer: Self-pay | Admitting: Nurse Practitioner

## 2023-12-07 VITALS — BP 130/86 | HR 72 | Temp 97.8°F | Wt 177.0 lb

## 2023-12-07 DIAGNOSIS — E66812 Obesity, class 2: Secondary | ICD-10-CM

## 2023-12-07 DIAGNOSIS — R7303 Prediabetes: Secondary | ICD-10-CM

## 2023-12-07 DIAGNOSIS — Z6836 Body mass index (BMI) 36.0-36.9, adult: Secondary | ICD-10-CM

## 2023-12-07 NOTE — Assessment & Plan Note (Addendum)
 Lab Results  Component Value Date   HGBA1C 6.3 (H) 06/17/2023  Patient counseled on low-carb diet Encouraged engage in regular moderate to vigorous exercises at least 150 minutes weekly.

## 2023-12-07 NOTE — Progress Notes (Signed)
 Acute Office Visit  Subjective:     Patient ID: Nina Hawkins, female    DOB: 1971/03/01, 53 y.o.   MRN: [id number]  No chief complaint on file.   HPI Nina Hawkins  has a past medical history of Pre-diabetes, Prediabetes, and Uterine cancer (HCC). She made this appointment to have her labs reviewed, labs were done in September 2024.  All her labs to be reviewed today and all questions answered.  Will repeat labs at her upcoming appointment in September.  Patient encouraged to come fasting for the appointment  She has upcoming appointment for mammogram and pap smear in March .  Patient encouraged to get flu vaccine and shingles vaccine at the pharmacy.  She is up-to-date with colon cancer screening  Interpretation services provided via [place] iPad    Review of Systems  Constitutional:  Negative for appetite change, chills, fatigue and fever.  HENT:  Negative for congestion, postnasal drip, rhinorrhea and sneezing.   Respiratory:  Negative for cough, shortness of breath and wheezing.   Cardiovascular:  Negative for chest pain, palpitations and leg swelling.  Gastrointestinal:  Negative for abdominal pain, constipation, nausea and vomiting.  Genitourinary:  Negative for difficulty urinating, dysuria, flank pain and frequency.  Musculoskeletal:  Negative for arthralgias, back pain, joint swelling and myalgias.  Skin:  Negative for color change, pallor, rash and wound.  Neurological:  Negative for dizziness, facial asymmetry, weakness, numbness and headaches.  Psychiatric/Behavioral:  Negative for behavioral problems, confusion, self-injury and suicidal ideas.         Objective:    BP 130/86   Pulse 72   Temp 97.8 F (36.6 C)   Wt 177 lb (80.3 kg)   SpO2 100%   BMI 36.99 kg/m    Physical Exam Vitals and nursing note reviewed.  Constitutional:      General: She is not in acute distress.    Appearance: Normal appearance. She is obese. She is not ill-appearing,  toxic-appearing or diaphoretic.  HENT:     Mouth/Throat:     Mouth: Mucous membranes are moist.     Pharynx: Oropharynx is clear. No oropharyngeal exudate or posterior oropharyngeal erythema.  Eyes:     General: No scleral icterus.       Right eye: No discharge.        Left eye: No discharge.     Extraocular Movements: Extraocular movements intact.     Conjunctiva/sclera: Conjunctivae normal.  Cardiovascular:     Rate and Rhythm: Normal rate and regular rhythm.     Pulses: Normal pulses.     Heart sounds: Normal heart sounds. No murmur heard.    No friction rub. No gallop.  Pulmonary:     Effort: Pulmonary effort is normal. No respiratory distress.     Breath sounds: Normal breath sounds. No stridor. No wheezing, rhonchi or rales.  Chest:     Chest wall: No tenderness.  Abdominal:     General: There is no distension.     Palpations: Abdomen is soft.     Tenderness: There is no abdominal tenderness. There is no right CVA tenderness, left CVA tenderness or guarding.  Musculoskeletal:        General: No swelling, tenderness, deformity or signs of injury.     Right lower leg: No edema.     Left lower leg: No edema.  Skin:    General: Skin is warm and dry.     Capillary Refill: Capillary refill takes less than  2 seconds.     Coloration: Skin is not jaundiced or pale.     Findings: No bruising, erythema or lesion.  Neurological:     Mental Status: She is alert and oriented to person, place, and time.     Motor: No weakness.     Coordination: Coordination normal.     Gait: Gait normal.  Psychiatric:        Mood and Affect: Mood normal.        Behavior: Behavior normal.        Thought Content: Thought content normal.        Judgment: Judgment normal.     No results found for any visits on 12/07/23.      Assessment & Plan:   Problem List Items Addressed This Visit       Other   Prediabetes - Primary   Lab Results  Component Value Date   HGBA1C 6.3 (H) 06/17/2023   Patient counseled on low-carb diet Encouraged engage in regular moderate to vigorous exercises at least 150 minutes weekly.      Class 2 severe obesity due to excess calories with serious comorbidity and body mass index (BMI) of 36.0 to 36.9 in adult [facility])   Wt Readings from Last 3 Encounters:  12/07/23 177 lb (80.3 kg)  10/20/23 179 lb (81.2 kg)  06/11/23 177 lb (80.3 kg)   Body mass index is 36.99 kg/m.  Patient counseled on low carb diet  Encouraged to engage in regular moderate to vigorous exercises at least 150 minutes weekly Benefits of healthy weight discussed         No orders of the defined types were placed in this encounter.   No follow-ups on file.  Donell Beers, FNP

## 2023-12-07 NOTE — Assessment & Plan Note (Addendum)
 Wt Readings from Last 3 Encounters:  12/07/23 177 lb (80.3 kg)  10/20/23 179 lb (81.2 kg)  06/11/23 177 lb (80.3 kg)   Body mass index is 36.99 kg/m.  Patient counseled on low carb diet  Encouraged to engage in regular moderate to vigorous exercises at least 150 minutes weekly Benefits of healthy weight discussed

## 2023-12-07 NOTE — Patient Instructions (Signed)
 Please consider getting Shingrix and  influenza vaccine at local pharmacy.    It is important that you exercise regularly at least 30 minutes 5 times a week as tolerated  Think about what you will eat, plan ahead. Choose " clean, green, fresh or frozen" over canned, processed or packaged foods which are more sugary, salty and fatty. 70 to 75% of food eaten should be vegetables and fruit. Three meals at set times with snacks allowed between meals, but they must be fruit or vegetables. Aim to eat over a 12 hour period , example 7 am to 7 pm, and STOP after  your last meal of the day. Drink water,generally about 64 ounces per day, no other drink is as healthy. Fruit juice is best enjoyed in a healthy way, by EATING the fruit.  Thanks for choosing [facility] we consider it a privelige to serve you.

## 2023-12-30 ENCOUNTER — Ambulatory Visit: Payer: Self-pay | Admitting: Hematology and Oncology

## 2023-12-30 ENCOUNTER — Ambulatory Visit
Admission: RE | Admit: 2023-12-30 | Discharge: 2023-12-30 | Disposition: A | Discharge status: Home / Self Care | Payer: Self-pay | Source: Ambulatory Visit | Attending: Obstetrics and Gynecology | Admitting: Obstetrics and Gynecology

## 2023-12-30 VITALS — BP 140/99 | Wt 174.0 lb

## 2023-12-30 DIAGNOSIS — Z1231 Encounter for screening mammogram for malignant neoplasm of breast: Secondary | ICD-10-CM

## 2023-12-30 NOTE — Patient Instructions (Signed)
 Taught Nina Hawkins about self breast awareness and gave educational materials to take home. Patient did not need a Pap smear today due to last Pap smear was in 02/01/2024 per patient. Told patient about free cervical cancer screenings to receive a Pap smear if would like one next year. Let her know [facility] will cover Pap smears every 5 years unless has a history of abnormal Pap smears. Referred patient to the [facility] for screening mammogram. Appointment scheduled for 12/30/23. Patient aware of appointment and will be there. Let patient know will follow up with her within the next couple weeks with results. Nina Hawkins verbalized understanding.  Pascal Lux, NP 2:56 PM

## 2023-12-30 NOTE — Progress Notes (Signed)
 Nina Hawkins is a 53 y.o. female who presents to [facility] clinic today with no complaints.    Pap Smear: Pap not smear completed today. Last Pap smear was 01/31/2021 and was normal. Per patient has no history of an abnormal Pap smear. Last Pap smear result is available in Epic. Will repeat Pap 02/08/2024   Physical exam: Breasts Breasts symmetrical. No skin abnormalities bilateral breasts. No nipple retraction bilateral breasts. No nipple discharge bilateral breasts. No lymphadenopathy. No lumps palpated bilateral breasts.       Pelvic/Bimanual Pap is not indicated today    Smoking History: Patient has never smoked and was not referred to quit line.    Patient Navigation: Patient education provided. Access to services provided for patient through [facility] program. [place] interpreter provided. No transportation provided   Colorectal Cancer Screening: Per patient has never had colonoscopy completed No complaints today. Completed Cologuard in January 2025   Breast and Cervical Cancer Risk Assessment: Patient does not have family history of breast cancer, known genetic mutations, or radiation treatment to the chest before age 35. Patient does not have history of cervical dysplasia, immunocompromised, or DES exposure in-utero.  Risk Scores as of Encounter on 12/30/2023     Nina Hawkins           5-year 0.94%   Lifetime 7.78%   This patient is [place] but has no documented birth country, so the [place] model used data from [place] patients to calculate their risk score. Document a birth country in the Demographics activity for a more accurate score.         Last calculated by Caprice Red, CMA on 12/30/2023 at  2:27 PM        A: [facility] exam without pap smear No complaints with benign exam.   P: Referred patient to the [facility] for a screening mammogram. Appointment scheduled 12/30/23.  Pascal Lux, NP 12/30/2023 2:54 PM

## 2024-02-08 ENCOUNTER — Other Ambulatory Visit: Payer: Self-pay | Admitting: *Deleted

## 2024-02-08 DIAGNOSIS — R32 Unspecified urinary incontinence: Secondary | ICD-10-CM

## 2024-02-08 DIAGNOSIS — Z124 Encounter for screening for malignant neoplasm of cervix: Secondary | ICD-10-CM

## 2024-02-08 NOTE — Progress Notes (Signed)
 Patient: Nina Hawkins           Date of Birth: Mar 05, 1971           MRN: [id number] Visit Date: 02/08/2024 PCP: Paseda, Folashade R, FNP  Cervical Cancer Screening Do you smoke?: No Have you ever had or been told you have an allergy to latex products?: No Marital status: Single Date of last pap smear: 1-2 yrs ago Date of last menstrual period:  (Hysterectomy (Ovaries remain)) Number of pregnancies: 4 Number of births: 3 Have you ever had any of the following? Hysterectomy: Yes Tubal ligation (tubes tied): No Abnormal bleeding: No Abnormal pap smear: No Venereal warts: No A sex partner with venereal warts: No A high risk* sex partner: No  Cervical Exam  Abnormal Observations: Normal Exam. Complained of stress incontinence. Sent referral to Uro-Gynecology. Recommendations: Last Pap smear 01/31/2021 and normal. Patient has a history of cervical cancer 8 years that she had a hysterectomy for follow up. Patient stated all Pap smears have been normal since hysterectomy. Per patient has a history of two other abnormal Pap smears prior to cervical cancer diagnosis that a colposcopy was completed for follow up. Last Pap smear result is available in EPIC. Let patient know if today's Pap smear is normal and HPV negative that her next Pap smear is due in one year due to her history of cervical cancer. Informed patient that will follow up with her within the next couple of weeks with results of her Pap smear by letter or phone. Patient verbalized understanding.     Spanish interpreter Herma Longest from [facility] provided.   Patient's History Patient Active Problem List   Diagnosis Date Noted   Sore throat 10/20/2023   Allergic rhinitis 10/20/2023   Stress incontinence of urine 10/20/2023   Elevated BP without diagnosis of hypertension 10/20/2023   Screening mammogram for breast cancer 06/11/2023   Screening for colon cancer 06/11/2023   Candidiasis, intertrigo 06/11/2023   Annual  physical exam 06/11/2023   Class 2 severe obesity due to excess calories with serious comorbidity and body mass index (BMI) of 36.0 to 36.9 in adult [facility]) 06/11/2023   Elevated red blood cell count 04/02/2021   Generalized abdominal pain 04/02/2021   Prediabetes 04/02/2021   Pruritic disorder 04/02/2021   Irritant contact dermatitis 04/02/2021   Closed right maxillary fracture (HCC) 05/03/2019   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 pylori infection 02/03/2017   Cervicalgia 02/02/2017   Abdominal pain, epigastric 02/02/2017   Chronic left-sided low back pain without sciatica 02/02/2017   Past Medical History:  Diagnosis Date   Pre-diabetes    Prediabetes    Uterine cancer (HCC)     Family History  Problem Relation Age of Onset   Osteoarthritis Mother    Heart disease Mother    Stroke Neg Hx    Breast cancer Neg Hx     Social History   Occupational History   Not on file  Tobacco Use   Smoking status: Never   Smokeless tobacco: Never  Vaping Use   Vaping status: Never Used  Substance and Sexual Activity   Alcohol use: Yes    Comment: occasionally   Drug use: No   Sexual activity: Not Currently    Birth control/protection: Surgical

## 2024-02-08 NOTE — Patient Instructions (Signed)
 Nina Hawkins

## 2024-02-10 ENCOUNTER — Telehealth: Payer: Self-pay

## 2024-02-10 LAB — CYTOLOGY - PAP
Comment: NEGATIVE
Diagnosis: NEGATIVE
High risk HPV: NEGATIVE

## 2024-02-10 NOTE — Telephone Encounter (Signed)
 TE opened in error.

## 2024-04-18 ENCOUNTER — Other Ambulatory Visit: Payer: Self-pay

## 2024-04-18 ENCOUNTER — Ambulatory Visit (HOSPITAL_COMMUNITY)
Admission: EM | Admit: 2024-04-18 | Discharge: 2024-04-18 | Disposition: A | Discharge status: Home / Self Care | Attending: Emergency Medicine | Admitting: Emergency Medicine

## 2024-04-18 ENCOUNTER — Encounter (HOSPITAL_COMMUNITY): Payer: Self-pay

## 2024-04-18 DIAGNOSIS — L304 Erythema intertrigo: Secondary | ICD-10-CM

## 2024-04-18 DIAGNOSIS — G8929 Other chronic pain: Secondary | ICD-10-CM

## 2024-04-18 DIAGNOSIS — R1013 Epigastric pain: Secondary | ICD-10-CM

## 2024-04-18 MED ORDER — CLOTRIMAZOLE 1 % EX CREA
TOPICAL_CREAM | CUTANEOUS | 0 refills | Status: DC
  Filled 2024-04-18: qty 28, fill #0

## 2024-04-18 MED ORDER — PANTOPRAZOLE SODIUM 40 MG PO TBEC
40.0000 mg | DELAYED_RELEASE_TABLET | Freq: Every day | ORAL | 0 refills | Status: DC
  Filled 2024-04-18: qty 14, 14d supply, fill #0

## 2024-04-18 NOTE — ED Provider Notes (Signed)
 [facility]    CSN: [id number] Arrival date & time: 04/18/24  1628      History   Chief Complaint Chief Complaint  Patient presents with   Abdominal Pain    HPI Nina Hawkins is a 53 y.o. female.  Medical interpretor used for encounter 3 week history of abdominal pain, located epigastric  Worse after eating.  Has used maalox with some relief. Also reports using miralax  as needed. Normal BMs, last was 2 days ago. No urinary symptoms Denies nausea or vomiting  Not having fever.  History of abdominal pain several visits in the past 5 years. Has taken omeprazole  previously. She had positive h.pylori breath test 5 years ago. She never had repeat testing.  In 2022 was advised to have endoscopy if symptoms worsened or returned. Has not been seen since then.  Reports pain has been mild since then, and just worsened in the last 3 weeks.   Additionally 1 week history of red itching rash on lower abdomen. Noted in the skin folds. Also had under her breasts in the past, not now. She reports history of this recurrently.   She reports she does have insurance although was registered as self pay   Past Medical History:  Diagnosis Date   Pre-diabetes    Prediabetes    Uterine cancer (HCC)     Patient Active Problem List   Diagnosis Date Noted   Sore throat 10/20/2023   Allergic rhinitis 10/20/2023   Stress incontinence of urine 10/20/2023   Elevated BP without diagnosis of hypertension 10/20/2023   Screening mammogram for breast cancer 06/11/2023   Screening for colon cancer 06/11/2023   Candidiasis, intertrigo 06/11/2023   Annual physical exam 06/11/2023   Class 2 severe obesity due to excess calories with serious comorbidity and body mass index (BMI) of 36.0 to 36.9 in adult (HCC) 06/11/2023   Elevated red blood cell count 04/02/2021   Generalized abdominal pain 04/02/2021   Prediabetes 04/02/2021   Pruritic disorder 04/02/2021   Irritant contact  dermatitis 04/02/2021   Closed right maxillary fracture (HCC) 05/03/2019   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 pylori infection 02/03/2017   Cervicalgia 02/02/2017   Abdominal pain, epigastric 02/02/2017   Chronic left-sided low back pain without sciatica 02/02/2017    Past Surgical History:  Procedure Laterality Date   ABDOMINAL HYSTERECTOMY      OB History   No obstetric history on file.      Home Medications    Prior to Admission medications   Medication Sig Start Date End Date Taking? Authorizing Provider  clotrimazole  (LOTRIMIN ) 1 % cream Dos veces al dia [employer] semanas 04/18/24  Yes Chirsty Armistead, [place], PA-C  pantoprazole  (PROTONIX ) 40 MG tablet Take 1 tablet (40 mg total) by mouth daily for 14 days. Una tableta un vez al dia durante dos semanas 04/18/24 05/02/24 Yes Chundra Sauerwein, PA-C  Omega-3 Fatty Acids (OMEGA 3 PO) Take by mouth.    [provider]    Family History Family History  Problem Relation Age of Onset   Osteoarthritis Mother    Heart disease Mother    Stroke Neg Hx    Breast cancer Neg Hx     Social History Social History   Tobacco Use   Smoking status: Never   Smokeless tobacco: Never  Vaping Use   Vaping status: Never Used  Substance Use Topics   Alcohol use: Yes  Comment: occasionally   Drug use: No     Allergies   Patient has no known allergies.   Review of Systems Review of Systems  Gastrointestinal:  Positive for abdominal pain.   As per HPI  Physical Exam Triage Vital Signs ED Triage Vitals  Encounter Vitals Group     BP      Girls Systolic BP Percentile      Girls Diastolic BP Percentile      Boys Systolic BP Percentile      Boys Diastolic BP Percentile      Pulse      Resp      Temp      Temp src      SpO2      Weight      Height      Head Circumference      Peak Flow      Pain Score      Pain Loc      Pain Education       Exclude from Growth Chart    No data found.  Updated Vital Signs BP 136/80 (BP Location: Right Arm)   Pulse 90   Temp 98.4 F (36.9 C) (Oral)   Resp 18   SpO2 95%    Physical Exam Vitals and nursing note reviewed.  Constitutional:      Appearance: Normal appearance.  HENT:     Mouth/Throat:     Mouth: Mucous membranes are moist.     Pharynx: Oropharynx is clear.  Eyes:     Conjunctiva/sclera: Conjunctivae normal.  Cardiovascular:     Rate and Rhythm: Normal rate and regular rhythm.     Heart sounds: Normal heart sounds.  Pulmonary:     Effort: Pulmonary effort is normal. No respiratory distress.     Breath sounds: Normal breath sounds.  Abdominal:     General: Bowel sounds are normal.     Palpations: Abdomen is soft. There is no mass.     Tenderness: There is abdominal tenderness in the epigastric area. There is no right CVA tenderness, left CVA tenderness, guarding or rebound.  Musculoskeletal:        General: Normal range of motion.  Skin:    General: Skin is warm and dry.     Findings: Rash present.     Comments: Candidal rash on the lower abdomen, in the folds of skin. No rash under breasts at this time   Neurological:     Mental Status: She is alert and oriented to person, place, and time.     UC Treatments / Results  Labs (all labs ordered are listed, but only abnormal results are displayed) Labs Reviewed - No data to display  EKG   Radiology No results found.  Procedures Procedures (including critical care time)  Medications Ordered in UC Medications - No data to display  Initial Impression / Assessment and Plan / UC Course  I have reviewed the triage vital signs and the nursing notes.  Pertinent labs & imaging results that were available during my care of the patient were reviewed by me and considered in my medical decision making (see chart for details).  Epigastric pain on and off for 5 years. Worsening last 3 weeks. Maalox helpful but  persisting. Start Protonix  daily x 14 days and GI referral placed. Discussed importance of follow up with specialist. Patient verbalizes understanding. ED Precautions   Rash consistent with intertrigo.  Clotrimazole  cream twice daily for 1 to 2 weeks.  Final Clinical Impressions(s) / UC Diagnoses   Final diagnoses:  Epigastric pain  Abdominal pain, chronic, epigastric  Intertrigo     Discharge Instructions      take the Protonix  one pill daily for 2 days in a row. take first thing in the morning on an empty stomach. i have placed a referral to the stomach doctor, they will call you to make an appointment. its very important you follow up with them.  for the rash, apply the clotrimazole  cream twice daily to the areas for 2 weeks in a row   tome el Protonix  una cpsula diaria durante 2 das seguidos. tmelo a primera hora de la maana con el estmago vaco. he enviado una referencia al mdico del estmago, Management consultant para hacer una cita. es muy importante que haga un seguimiento con ellos.  para la erupcin, aplique la crema de clotrimazol dos veces al da en las reas durante 2 semanas seguidas      ED Prescriptions     Medication Sig Dispense Auth. Provider   pantoprazole  (PROTONIX ) 40 MG tablet Take 1 tablet (40 mg total) by mouth daily for 14 days. Una tableta un vez al dia durante dos semanas 14 tablet Jaliana [place], [place], PA-C   clotrimazole  (LOTRIMIN ) 1 % cream Dos veces al dia [employer] 28 g Koran Seabrook, [place], [place]      PDMP not reviewed this encounter.   Jeryl Stabs, [place] 04/18/24 1744

## 2024-04-18 NOTE — ED Triage Notes (Signed)
 Pt c/o center abdominal pain x3wks after eating. States taken Maalox with relief. States last NBM was 2 days ago. Denies urinary sx's.  Pt c/o red itchy rash to lower abdomen and sometimes under her breast.

## 2024-04-18 NOTE — Discharge Instructions (Addendum)
 take the Protonix  one pill daily for 2 days in a row. take first thing in the morning on an empty stomach. i have placed a referral to the stomach doctor, they will call you to make an appointment. its very important you follow up with them.  for the rash, apply the clotrimazole  cream twice daily to the areas for 2 weeks in a row   tome el Protonix  una cpsula diaria durante 2 das seguidos. tmelo a primera hora de la maana con el estmago vaco. he enviado una referencia al mdico del estmago, Management consultant para hacer una cita. es muy importante que haga un seguimiento con ellos.  para la erupcin, aplique la crema de clotrimazol dos veces al da en las reas durante 2 semanas seguidas

## 2024-04-19 ENCOUNTER — Other Ambulatory Visit: Payer: Self-pay

## 2024-05-22 ENCOUNTER — Ambulatory Visit (HOSPITAL_COMMUNITY)
Admission: EM | Admit: 2024-05-22 | Discharge: 2024-05-22 | Disposition: A | Discharge status: Home / Self Care | Attending: Internal Medicine | Admitting: Internal Medicine

## 2024-05-22 ENCOUNTER — Encounter (HOSPITAL_COMMUNITY): Payer: Self-pay

## 2024-05-22 DIAGNOSIS — L299 Pruritus, unspecified: Secondary | ICD-10-CM

## 2024-05-22 DIAGNOSIS — L2489 Irritant contact dermatitis due to other agents: Secondary | ICD-10-CM

## 2024-05-22 MED ORDER — CEPHALEXIN 500 MG PO CAPS: 500.0000 mg | ORAL_CAPSULE | Freq: Two times a day (BID) | ORAL | 0 refills | Status: AC

## 2024-05-22 MED ORDER — TRIAMCINOLONE ACETONIDE 0.1 % EX CREA: 1.0000 | TOPICAL_CREAM | Freq: Two times a day (BID) | CUTANEOUS | 0 refills | Status: AC

## 2024-05-22 NOTE — ED Provider Notes (Signed)
 [facility]   Note:  This document was prepared using Dragon voice recognition software and may include unintentional dictation errors.  MRN: [id number] DOB: 16-Aug-1971 DATE: 05/22/24   Subjective:  Chief Complaint:  Chief Complaint  Patient presents with   Rash     HPI: Nina Hawkins is a 53 y.o. female presenting for rash to her anterior neck for the past 8 days.  Interpreter services used for this encounter.  Patient states 8 days ago she had a sore throat and cough. At that time, she applied a vapor rub to her neck and chest. She states she used it in the past on hands and joint with relief. Soon after using the vapor rub, she noticed a rash to her anterior neck. She reports using left over Clotrimazole  with no relief. She feels that the rash has gotten worse. Reports pruritus and burning. She has noticed a slight clear discharge from the rash. She reports cough and sore throat have been improving. Denies fever, nausea/vomiting. Endorses rash, pruritus, discharge. Presents NAD.  Prior to Admission medications   Medication Sig Start Date End Date Taking? Authorizing Provider  Omega-3 Fatty Acids (OMEGA 3 PO) Take by mouth.    [provider]  pantoprazole  (PROTONIX ) 40 MG tablet Take 1 tablet (40 mg total) by mouth daily for 14 days. Una tableta un vez al dia durante dos semanas 04/18/24 05/03/24  Rising, [place], PA-C     No Known Allergies  History:   Past Medical History:  Diagnosis Date   Pre-diabetes    Prediabetes    Uterine cancer (HCC)      Past Surgical History:  Procedure Laterality Date   ABDOMINAL HYSTERECTOMY      Family History  Problem Relation Age of Onset   Osteoarthritis Mother    Heart disease Mother    Stroke Neg Hx    Breast cancer Neg Hx     Social History   Tobacco Use   Smoking status: Never   Smokeless tobacco: Never  Vaping Use   Vaping status: Never Used  Substance Use Topics   Alcohol use: Yes    Comment:  occasionally   Drug use: No    Review of Systems  Constitutional:  Negative for fever.  HENT:  Negative for sore throat.   Respiratory:  Negative for cough.   Gastrointestinal:  Negative for nausea and vomiting.  Skin:  Positive for rash.     Objective:   Vitals: BP 129/87 (BP Location: Right Arm)   Pulse 84   Temp 98.4 F (36.9 C) (Oral)   Resp 16   SpO2 97%   Physical Exam Constitutional:      General: She is not in acute distress.    Appearance: Normal appearance. She is well-developed. She is obese. She is not ill-appearing or toxic-appearing.  HENT:     Head: Normocephalic and atraumatic.  Cardiovascular:     Rate and Rhythm: Normal rate and regular rhythm.     Heart sounds: Normal heart sounds.  Pulmonary:     Effort: Pulmonary effort is normal.     Breath sounds: Normal breath sounds.     Comments: Clear to auscultation bilaterally  Abdominal:     General: Bowel sounds are normal.     Palpations: Abdomen is soft.     Tenderness: There is no abdominal tenderness.  Lymphadenopathy:     Cervical: No cervical adenopathy.     Right cervical: No superficial cervical adenopathy.  Left cervical: No superficial cervical adenopathy.  Skin:    General: Skin is warm and dry.     Findings: Rash present. Rash is macular and papular.     Comments: Patient has pink to red maculopapular rash of anterior neck. Fissuring is noted throughout the rash as well as some flaking. No discharge. Slight warmth. TTP and pain with rotation.   Neurological:     General: No focal deficit present.     Mental Status: She is alert.  Psychiatric:        Mood and Affect: Mood and affect normal.     Results:  Labs: No results found for this or any previous visit (from the past 24 hours).  Radiology: No results found.   UC Course/Treatments:  Procedures: Procedures   Medications Ordered in UC: Medications - No data to display   Assessment and Plan :     ICD-10-CM   1.  Irritant contact dermatitis due to other agents  L24.89     2. Pruritus  L29.9      Irritant contact dermatitis due to other agents Pruritus Afebrile, nontoxic-appearing, NAD. VSS. DDX includes but not limited to: Contact dermatitis, intertrigo, cellulitis Suspect contact dermatitis from vapor rub. Given worsening of symptoms and clear discharge from the rash, concerned for secondary infection. Keflex  500mg  BID was prescribed prophetically.  Triamcinolone  0.1% twice daily was prescribed.  Recommend follow-up if no improvement.  Strict ED precautions were given and patient verbalized understanding.   ED Discharge Orders          Ordered    triamcinolone  cream (KENALOG ) 0.1 %  2 times daily        05/22/24 1845    cephALEXin  (KEFLEX ) 500 MG capsule  2 times daily        05/22/24 1846             PDMP not reviewed this encounter.     Basilia Ulanda SQUIBB, PA-C 05/22/24 1848

## 2024-05-22 NOTE — ED Triage Notes (Signed)
 Patient here today with c/o itchy rash on her neck X 8 days.

## 2024-05-22 NOTE — Discharge Instructions (Signed)
 I have sent an antibiotic (Keflex ) for you to take by mouth for rash as well as steroid cream. Take them as directed. Return in 2 to 3 days if no improvement. It is very important for you to pay attention to any new symptoms or worsening of your current condition. Please go directly to the Emergency Department immediately should you begin to feel worse in any way or have any of the following symptoms: persistent fevers, increased pain, increased swelling, increased redness, redness outside the demarcation or streaking down your neck.   Le he recetado un antibitico (Keflex ) para el sarpullido, as como una crema con esteroides. Tmelos segn las indicaciones. Si no mejora, regrese en 2 o 3 das. Es muy importante que est atento a cualquier sntoma nuevo o a un empeoramiento de su condicin actual. Por favor, acuda directamente a Urgencias de inmediato si empieza a sentirse peor o presenta alguno de los siguientes sntomas: fiebre persistente, aumento del dolor, aumento de la inflamacin, aumento del enrojecimiento, enrojecimiento fuera de la zona afectada o que se extiende por el cuello.

## 2024-06-13 ENCOUNTER — Ambulatory Visit: Payer: Self-pay | Admitting: Nurse Practitioner

## 2024-06-23 ENCOUNTER — Ambulatory Visit (INDEPENDENT_AMBULATORY_CARE_PROVIDER_SITE_OTHER): Payer: Self-pay | Admitting: Nurse Practitioner

## 2024-06-23 ENCOUNTER — Other Ambulatory Visit: Payer: Self-pay

## 2024-06-23 ENCOUNTER — Other Ambulatory Visit: Payer: Self-pay | Admitting: Nurse Practitioner

## 2024-06-23 ENCOUNTER — Encounter: Payer: Self-pay | Admitting: Nurse Practitioner

## 2024-06-23 VITALS — BP 144/94 | HR 65 | Temp 97.3°F | Ht <= 58 in | Wt 177.0 lb

## 2024-06-23 DIAGNOSIS — R21 Rash and other nonspecific skin eruption: Secondary | ICD-10-CM | POA: Insufficient documentation

## 2024-06-23 DIAGNOSIS — E66812 Obesity, class 2: Secondary | ICD-10-CM

## 2024-06-23 DIAGNOSIS — Z6836 Body mass index (BMI) 36.0-36.9, adult: Secondary | ICD-10-CM

## 2024-06-23 DIAGNOSIS — R1013 Epigastric pain: Secondary | ICD-10-CM

## 2024-06-23 DIAGNOSIS — Z130 Encounter for screening for diseases of the blood and blood-forming organs and certain disorders involving the immune mechanism: Secondary | ICD-10-CM

## 2024-06-23 DIAGNOSIS — Z13228 Encounter for screening for other metabolic disorders: Secondary | ICD-10-CM

## 2024-06-23 DIAGNOSIS — Z1321 Encounter for screening for nutritional disorder: Secondary | ICD-10-CM

## 2024-06-23 DIAGNOSIS — Z0000 Encounter for general adult medical examination without abnormal findings: Secondary | ICD-10-CM

## 2024-06-23 DIAGNOSIS — R7303 Prediabetes: Secondary | ICD-10-CM

## 2024-06-23 DIAGNOSIS — G4700 Insomnia, unspecified: Secondary | ICD-10-CM | POA: Insufficient documentation

## 2024-06-23 DIAGNOSIS — I10 Essential (primary) hypertension: Secondary | ICD-10-CM

## 2024-06-23 DIAGNOSIS — Z1329 Encounter for screening for other suspected endocrine disorder: Secondary | ICD-10-CM

## 2024-06-23 LAB — POCT GLYCOSYLATED HEMOGLOBIN (HGB A1C): Hemoglobin A1C: 6.1 % — AB (ref 4.0–5.6)

## 2024-06-23 MED ORDER — PANTOPRAZOLE SODIUM 40 MG PO TBEC
40.0000 mg | DELAYED_RELEASE_TABLET | Freq: Every day | ORAL | 1 refills | Status: AC
  Filled 2024-06-23: qty 30, 30d supply, fill #0
  Filled 2024-08-23: qty 30, 30d supply, fill #1

## 2024-06-23 MED ORDER — TRIAMCINOLONE ACETONIDE 0.1 % EX CREA
1.0000 | TOPICAL_CREAM | Freq: Two times a day (BID) | CUTANEOUS | 1 refills | Status: AC
Start: 2024-06-23 — End: ?
  Filled 2024-06-23: qty 30, 15d supply, fill #0
  Filled 2024-08-23: qty 30, 15d supply, fill #1

## 2024-06-23 NOTE — Patient Instructions (Addendum)
 Please consider getting influenza Shingrix and pneumococcal vaccine at local pharmacy.   Around 3 times per week, check your blood pressure 2 times per day. once in the morning and once in the evening. The readings should be at least one minute apart. Write down these values and bring them to your next nurse visit/appointment.  When you check your BP, make sure you have been doing something calm/relaxing 5 minutes prior to checking. Both feet should be flat on the floor and you should be sitting. Use your left arm and make sure it is in a relaxed position (on a table), and that the cuff is at the approximate level/height of your heart.     Rash and other nonspecific skin eruption  - triamcinolone  cream (KENALOG ) 0.1 %; Apply 1 Application topically 2 (two) times daily.  Dispense: 30 g; Refill: 1    It is important that you exercise regularly at least 30 minutes 5 times a week as tolerated  Think about what you will eat, plan ahead. Choose  clean, green, fresh or frozen over canned, processed or packaged foods which are more sugary, salty and fatty. 70 to 75% of food eaten should be vegetables and fruit. Three meals at set times with snacks allowed between meals, but they must be fruit or vegetables. Aim to eat over a 12 hour period , example 7 am to 7 pm, and STOP after  your last meal of the day. Drink water,generally about 64 ounces per day, no other drink is as healthy. Fruit juice is best enjoyed in a healthy way, by EATING the fruit.  Thanks for choosing [facility] we consider it a privelige to serve you.

## 2024-06-23 NOTE — Assessment & Plan Note (Addendum)
 Wt Readings from Last 3 Encounters:  06/23/24 177 lb (80.3 kg)  12/30/23 174 lb (78.9 kg)  12/07/23 177 lb (80.3 kg)   Body mass index is 38.3 kg/m.  discussed dietary modifications to manage weight including reducing sugar, sweets, soda, and high carbohydrate foods. Recommended diet with 70-80% vegetables and protein, less carbohydrates. Regular moderate exercises at least 30 minutes 5 days a week advised

## 2024-06-23 NOTE — Telephone Encounter (Signed)
 Pt was just seen. Please advise. kh

## 2024-06-23 NOTE — Assessment & Plan Note (Signed)
 I have refilled pantoprazole  40 mg tablet to be taken  only as needed for abdominal pain, I am suspecting that she has acid reflux,   avoid caffeinated drinks, spicy food, fatty fried foods and other foods that triggers abdominal pain.

## 2024-06-23 NOTE — Assessment & Plan Note (Signed)
  Insomnia for four months. Discussed sleep hygiene and use of over-the-counter melatonin. - Recommend over-the-counter melatonin as needed for sleep. - Advise on sleep hygiene practices: turn off lights, ensure room is quiet and dark, avoid daytime naps, and avoid coffee at night.

## 2024-06-23 NOTE — Progress Notes (Addendum)
 Complete physical exam  Patient: Nina Hawkins   DOB: [birth year]   53 y.o. Female  MRN: [id number]  Subjective:    Chief Complaint  Patient presents with   Annual Exam   Discussed the use of AI scribe software for clinical note transcription with the patient, who gave verbal consent to proceed.  History of Present Illness Nina Hawkins is a 53 year old female  has a past medical history of Pre-diabetes, Prediabetes, and Uterine cancer (HCC).  who presents for a CPE .  She has been experiencing recurrent skin rashes, initially appearing on her lower abdomen and later spreading to her neck and under her arm. Despite completing a course of antibiotics and using triamcinolone  cream prescribed in August, the rashes have not resolved and have recently reappeared under her arm.no new skincare products used.    She has been experiencing sleep disturbances for the past four months, sometimes achieving eight hours of sleep but often struggling to sleep. She attributes this to various factors, including daily coffee consumption, which might affect her sleep.  She drinks beer every weekend or every four days and does not engage in regular physical activity, citing work as her primary activity. She follows a normal diet. No drug use or smoking is reported.  She was prescribed pantoprazole  40 mg daily for 14 days in July for complaints of epigastric pain, she stated that her epigastric pain has since resolved.  She thought pantoprazole  was prescribed for her rashes because at that time she had Candida rash on her lower abdomen in the folds of her skin  Interpretation services provided by medical interpreter  Assessment and Plan Assessment & Plan Class 2 obesity due to excess calories Class 2 obesity with BMI 36.0-36.9. Discussed impact on blood pressure and blood sugar. Emphasized weight loss through diet and exercise. - Encourage weight loss through diet and exercise. - Advise to  avoid salty foods.  Elevated blood pressure, not yet diagnosed as hypertension Elevated blood pressure noted. She prefers to avoid medication. Discussed weight loss and dietary changes for management. - Recheck blood pressure in one to two months. - Encourage weight loss and dietary changes to manage blood pressure.  Prediabetes Prediabetes. Discussed dietary modifications to manage blood sugar, including reducing sugar, sweets, soda, and high carbohydrate foods. Recommended diet with 70-80% vegetables and protein, less carbohydrates. - Advise dietary modifications to manage blood sugar levels. - Encourage a diet with 70-80% vegetables and protein, less carbohydrates.  Recurrent skin eruptions Recurrent skin eruptions under the arm. Previous treatment with triamcinolone  cream and antibiotics. Suspected allergic reaction. - Refill triamcinolone  cream, apply twice daily for one to two weeks. - Consider referral to allergist or dermatologist if condition persists.  Insomnia Insomnia for four months. Discussed sleep hygiene and use of over-the-counter melatonin. - Recommend over-the-counter melatonin as needed for sleep. - Advise on sleep hygiene practices: turn off lights, ensure room is quiet and dark, avoid daytime naps, and avoid coffee at night.    Most recent fall risk assessment:    01/31/2021    9:39 AM  Fall Risk   Falls in the past year? 0     Most recent depression screenings:    06/23/2024   10:29 AM 12/07/2023    9:39 AM  PHQ 2/9 Scores  PHQ - 2 Score 0 0  PHQ- 9 Score 0         Patient Care Team: Woodward Klem R, FNP as PCP - General (Nurse Practitioner)  Outpatient Medications Prior to Visit  Medication Sig   Omega-3 Fatty Acids (OMEGA 3 PO) Take by mouth.   [DISCONTINUED] pantoprazole  (PROTONIX ) 40 MG tablet Take 1 tablet (40 mg total) by mouth daily for 14 days. Una tableta un vez al dia [employer]   [DISCONTINUED] triamcinolone  cream  (KENALOG ) 0.1 % Apply 1 Application topically 2 (two) times daily.   No facility-administered medications prior to visit.    Review of Systems  Constitutional:  Negative for appetite change, chills, fatigue and fever.  HENT:  Negative for congestion, postnasal drip, rhinorrhea and sneezing.   Eyes:  Negative for discharge and redness.  Respiratory:  Negative for cough, shortness of breath and wheezing.   Cardiovascular:  Negative for chest pain, palpitations and leg swelling.  Gastrointestinal:  Negative for abdominal pain, constipation, nausea and vomiting.  Genitourinary:  Negative for difficulty urinating, dysuria, flank pain and frequency.  Musculoskeletal:  Negative for arthralgias, back pain, joint swelling and myalgias.  Skin:  Positive for rash. Negative for color change, pallor and wound.  Neurological:  Negative for dizziness, facial asymmetry, weakness, numbness and headaches.  Psychiatric/Behavioral:  Negative for behavioral problems, confusion, self-injury and suicidal ideas.        Objective:     BP (!) 144/94   Pulse 65   Temp (!) 97.3 F (36.3 C)   Ht 4' 9 (1.448 m)   Wt 177 lb (80.3 kg)   SpO2 100%   BMI 38.30 kg/m    Physical Exam Vitals and nursing note reviewed.  Constitutional:      General: She is not in acute distress.    Appearance: Normal appearance. She is obese. She is not ill-appearing, toxic-appearing or diaphoretic.  HENT:     Right Ear: Tympanic membrane, ear canal and external ear normal. There is no impacted cerumen.     Left Ear: Tympanic membrane, ear canal and external ear normal. There is no impacted cerumen.     Nose: Nose normal. No congestion or rhinorrhea.     Mouth/Throat:     Mouth: Mucous membranes are moist.     Pharynx: Oropharynx is clear. No oropharyngeal exudate or posterior oropharyngeal erythema.  Eyes:     General: No scleral icterus.       Right eye: No discharge.        Left eye: No discharge.     Extraocular  Movements: Extraocular movements intact.     Conjunctiva/sclera: Conjunctivae normal.  Neck:     Vascular: No carotid bruit.  Cardiovascular:     Rate and Rhythm: Normal rate and regular rhythm.     Pulses: Normal pulses.     Heart sounds: Normal heart sounds. No murmur heard.    No friction rub. No gallop.  Pulmonary:     Effort: Pulmonary effort is normal. No respiratory distress.     Breath sounds: Normal breath sounds. No stridor. No wheezing, rhonchi or rales.  Chest:     Chest wall: No tenderness.  Abdominal:     General: Bowel sounds are normal. There is no distension.     Palpations: Abdomen is soft. There is no mass.     Tenderness: There is no abdominal tenderness. There is no right CVA tenderness, left CVA tenderness, guarding or rebound.     Hernia: No hernia is present.  Musculoskeletal:        General: No swelling, tenderness, deformity or signs of injury.     Cervical back: Normal range of motion  and neck supple. No rigidity or tenderness.     Right lower leg: No edema.     Left lower leg: No edema.  Lymphadenopathy:     Cervical: No cervical adenopathy.  Skin:    General: Skin is warm and dry.     Capillary Refill: Capillary refill takes less than 2 seconds.     Coloration: Skin is not jaundiced or pale.     Findings: Erythema and rash present. No bruising or lesion.     Comments: Erythematous maculopapular rashes noted on left axillary area  Neurological:     Mental Status: She is alert and oriented to person, place, and time.     Cranial Nerves: No cranial nerve deficit.     Motor: No weakness.     Gait: Gait normal.  Psychiatric:        Mood and Affect: Mood normal.        Behavior: Behavior normal.        Thought Content: Thought content normal.        Judgment: Judgment normal.     Results for orders placed or performed in visit on 06/23/24  POCT glycosylated hemoglobin (Hb A1C)  Result Value Ref Range   Hemoglobin A1C 6.1 (A) 4.0 - 5.6 %   HbA1c  POC (<> result, manual entry)     HbA1c, POC (prediabetic range)     HbA1c, POC (controlled diabetic range)         Assessment & Plan:    Routine Health Maintenance and Physical Exam  Immunization History  Administered Date(s) Administered   Influenza,inj,Quad PF,6+ Mos 11/09/2017, 06/22/2018, 12/05/2020   Tdap 02/02/2017    Health Maintenance  Topic Date Due   Hepatitis B Vaccines 19-59 Average Risk (1 of 3 - 19+ 3-dose series) Never done   Pneumococcal Vaccine: 50+ Years (1 of 1 - PCV) Never done   Zoster Vaccines- Shingrix (1 of 2) Never done   Influenza Vaccine  05/12/2024   COVID-19 Vaccine (1 - 2024-25 season) Never done   Mammogram  12/29/2025   Fecal DNA (Cologuard)  08/19/2026   DTaP/Tdap/Td (2 - Td or Tdap) 02/03/2027   Cervical Cancer Screening (HPV/Pap Cotest)  02/07/2029   Hepatitis C Screening  Completed   HIV Screening  Completed   HPV VACCINES  Aged Out   Meningococcal B Vaccine  Aged Out    Discussed health benefits of physical activity, and encouraged her to engage in regular exercise appropriate for her age and condition.  Problem List Items Addressed This Visit       Cardiovascular and Mediastinum   Hypertension   DASH diet and commitment to daily physical activity for a minimum of 30 minutes discussed and encouraged, as a part of hypertension management. The importance of attaining a healthy weight is also discussed.  She prefers to avoid medication. - Recheck blood pressure in one to two months.       06/23/2024   10:41 AM 06/23/2024   10:27 AM 05/22/2024    6:02 PM 04/18/2024    4:51 PM 12/30/2023    2:19 PM 12/07/2023    9:35 AM 10/20/2023    3:22 PM  BP/Weight  Systolic BP 144 124 129 136 140 130 154  Diastolic BP 94 94 87 80 99 86 93  Wt. (Lbs)  177   174 177   BMI  38.3 kg/m2   36.37 kg/m2 36.99 kg/m2  Musculoskeletal and Integument   Rash and other nonspecific skin eruption    Recurrent skin eruptions under the arm.  Previous treatment with triamcinolone  cream and antibiotics. Suspected allergic reaction. - Refill triamcinolone  cream, apply twice daily for one to two weeks. - Consider referral to allergist        Relevant Medications   triamcinolone  cream (KENALOG ) 0.1 %   Other Relevant Orders   Ambulatory referral to Allergy     Other   Abdominal pain, epigastric   I have refilled pantoprazole  40 mg tablet to be taken  only as needed for abdominal pain, I am suspecting that she has acid reflux,   avoid caffeinated drinks, spicy food, fatty fried foods and other foods that triggers abdominal pain.      Relevant Medications   pantoprazole  (PROTONIX ) 40 MG tablet   Prediabetes   Lab Results  Component Value Date   HGBA1C 6.1 (A) 06/23/2024  Lab is stable patient counseled on low-carb diet      Relevant Orders   POCT glycosylated hemoglobin (Hb A1C) (Completed)   Annual physical exam - Primary   Annual exam as documented.  Counseling done include healthy lifestyle involving committing to 150 minutes of exercise per week, heart healthy diet, and attaining healthy weight. The importance of adequate sleep also discussed.   . Changes in health habits are decided on by patient with goals and time frames set for achieving them. Immunization and cancer screening  needs are specifically addressed at this visit.  Up-to-date with mammogram, and colon cancer screening encouraged to get shingles vaccine, pneumococcal vaccine and flu vaccine at the pharmacy      Class 2 severe obesity due to excess calories with serious comorbidity and body mass index (BMI) of 36.0 to 36.9 in adult [facility])   Wt Readings from Last 3 Encounters:  06/23/24 177 lb (80.3 kg)  12/30/23 174 lb (78.9 kg)  12/07/23 177 lb (80.3 kg)   Body mass index is 38.3 kg/m.  discussed dietary modifications to manage weight including reducing sugar, sweets, soda, and high carbohydrate foods. Recommended diet with 70-80% vegetables and  protein, less carbohydrates. Regular moderate exercises at least 30 minutes 5 days a week advised       Insomnia    Insomnia for four months. Discussed sleep hygiene and use of over-the-counter melatonin. - Recommend over-the-counter melatonin as needed for sleep. - Advise on sleep hygiene practices: turn off lights, ensure room is quiet and dark, avoid daytime naps, and avoid coffee at night.        Other Visit Diagnoses       Screening for endocrine, nutritional, metabolic and immunity disorder       Relevant Orders   CBC   CMP14+EGFR   Lipid panel      Return in about 2 months (around 08/23/2024) for HTN, FASTING LABS THIS WEEK.     Cesily Cuoco R Kayden Hutmacher, FNP

## 2024-06-23 NOTE — Assessment & Plan Note (Signed)
  Recurrent skin eruptions under the arm. Previous treatment with triamcinolone  cream and antibiotics. Suspected allergic reaction. - Refill triamcinolone  cream, apply twice daily for one to two weeks. - Consider referral to allergist

## 2024-06-23 NOTE — Assessment & Plan Note (Signed)
 Lab Results  Component Value Date   HGBA1C 6.1 (A) 06/23/2024  Lab is stable patient counseled on low-carb diet

## 2024-06-23 NOTE — Assessment & Plan Note (Addendum)
 Annual exam as documented.  Counseling done include healthy lifestyle involving committing to 150 minutes of exercise per week, heart healthy diet, and attaining healthy weight. The importance of adequate sleep also discussed.   . Changes in health habits are decided on by patient with goals and time frames set for achieving them. Immunization and cancer screening  needs are specifically addressed at this visit.  Up-to-date with mammogram, and colon cancer screening encouraged to get shingles vaccine, pneumococcal vaccine and flu vaccine at the pharmacy

## 2024-06-23 NOTE — Assessment & Plan Note (Signed)
 DASH diet and commitment to daily physical activity for a minimum of 30 minutes discussed and encouraged, as a part of hypertension management. The importance of attaining a healthy weight is also discussed.  She prefers to avoid medication. - Recheck blood pressure in one to two months.       06/23/2024   10:41 AM 06/23/2024   10:27 AM 05/22/2024    6:02 PM 04/18/2024    4:51 PM 12/30/2023    2:19 PM 12/07/2023    9:35 AM 10/20/2023    3:22 PM  BP/Weight  Systolic BP 144 124 129 136 140 130 154  Diastolic BP 94 94 87 80 99 86 93  Wt. (Lbs)  177   174 177   BMI  38.3 kg/m2   36.37 kg/m2 36.99 kg/m2

## 2024-06-24 ENCOUNTER — Other Ambulatory Visit: Payer: Self-pay

## 2024-06-25 ENCOUNTER — Other Ambulatory Visit: Payer: Self-pay

## 2024-06-26 ENCOUNTER — Other Ambulatory Visit: Payer: Self-pay

## 2024-06-26 ENCOUNTER — Telehealth: Payer: Self-pay

## 2024-06-26 NOTE — Telephone Encounter (Signed)
-----   Message from Folashade R [place] sent at 06/23/2024  7:15 PM EDT ----- I have refilled pantoprazole  40 mg tablet to be taking only as needed for abdominal pain, I am suspecting that she has acid reflux, this medication is not an antibiotics.  Tell her to avoid caffeinated drinks, spicy food, fatty fried foods and other foods that triggers abdominal pain. Thank you

## 2024-06-26 NOTE — Telephone Encounter (Signed)
 Lvm advising pt pantoprazole  was sent in for her abd pain. Please pick up. KH

## 2024-07-10 ENCOUNTER — Other Ambulatory Visit: Payer: Self-pay

## 2024-07-10 DIAGNOSIS — Z130 Encounter for screening for diseases of the blood and blood-forming organs and certain disorders involving the immune mechanism: Secondary | ICD-10-CM

## 2024-07-11 ENCOUNTER — Ambulatory Visit: Payer: Self-pay | Admitting: Nurse Practitioner

## 2024-07-11 LAB — LIPID PANEL
Chol/HDL Ratio: 2.6 ratio (ref 0.0–4.4)
Cholesterol, Total: 160 mg/dL (ref 100–199)
HDL: 62 mg/dL (ref >39)
LDL Chol Calc (NIH): 76 mg/dL (ref 0–99)
Triglycerides: 126 mg/dL (ref 0–149)
VLDL Cholesterol Cal: 22 mg/dL (ref 5–40)

## 2024-07-11 LAB — CBC
Hematocrit: 44.5 % (ref 34.0–46.6)
Hemoglobin: 14.2 g/dL (ref 11.1–15.9)
MCH: 27.9 pg (ref 26.6–33.0)
MCHC: 31.9 g/dL (ref 31.5–35.7)
MCV: 87 fL (ref 79–97)
Platelets: 270 x10E3/uL (ref 150–450)
RBC: 5.09 x10E6/uL (ref 3.77–5.28)
RDW: 13.1 % (ref 11.7–15.4)
WBC: 5.7 x10E3/uL (ref 3.4–10.8)

## 2024-07-11 LAB — CMP14+EGFR
ALT: 21 IU/L (ref 0–32)
AST: 20 IU/L (ref 0–40)
Albumin: 4 g/dL (ref 3.8–4.9)
Alkaline Phosphatase: 88 IU/L (ref 49–135)
BUN/Creatinine Ratio: 19 (ref 9–23)
BUN: 13 mg/dL (ref 6–24)
Bilirubin Total: 0.3 mg/dL (ref 0.0–1.2)
CO2: 24 mmol/L (ref 20–29)
Calcium: 9.1 mg/dL (ref 8.7–10.2)
Chloride: 103 mmol/L (ref 96–106)
Creatinine, Ser: 0.67 mg/dL (ref 0.57–1.00)
Globulin, Total: 2.6 g/dL (ref 1.5–4.5)
Glucose: 115 mg/dL — ABNORMAL HIGH (ref 70–99)
Potassium: 4 mmol/L (ref 3.5–5.2)
Sodium: 138 mmol/L (ref 134–144)
Total Protein: 6.6 g/dL (ref 6.0–8.5)
eGFR: 105 mL/min/1.73 (ref >59)

## 2024-07-11 NOTE — Telephone Encounter (Signed)
Repeated message to patient.

## 2024-07-17 ENCOUNTER — Encounter: Payer: Self-pay | Admitting: Emergency Medicine

## 2024-07-19 ENCOUNTER — Ambulatory Visit: Admitting: Obstetrics

## 2024-07-19 NOTE — Progress Notes (Deleted)
 New Patient Evaluation and Consultation  Referring Provider: Alger Gong, MD PCP: Paseda, Folashade R, FNP Date of Service: 07/19/2024  SUBJECTIVE Chief Complaint: No chief complaint on file.  History of Present Illness: Nina Hawkins is a 53 y.o. hispanic female seen in consultation at the request of Dr Alger for evaluation of ***stress urinary incontinence.    Spanish interpreter ***  ***Review of records significant for: ***chronic left-sided low back pain, Pre-DM, generalized abdominal pain  Urinary Symptoms: {urine leakage?:24754} Leaks *** time(s) per {days/wks/mos/yrs:310907}.  Pad use: {NUMBERS 1-10:18281} {pad option:24752} per day.   Patient {ACTION; IS/IS WNU:78978602} bothered by UI symptoms.  Day time voids ***.  Nocturia: *** times per night to void. Voiding dysfunction:  {empties:24755} bladder well.  Patient {DOES NOT does:27190::does not} use a catheter to empty bladder.  When urinating, patient feels {urine symptoms:24756} Drinks: *** per day  UTIs: {NUMBERS 1-10:18281} UTI's in the last year.   {ACTIONS;DENIES/REPORTS:21021675::Denies} history of {urologic concerns:24757} No results found for the last 90 days.   Pelvic Organ Prolapse Symptoms:                  Patient {denies/ admits to:24761} a feeling of a bulge the vaginal area. It has been present for {NUMBER 1-10:22536} {days/wks/mos/yrs:310907}.  Patient {denies/ admits to:24761} seeing a bulge.  This bulge {ACTION; IS/IS WNU:78978602} bothersome.  Bowel Symptom: Bowel movements: *** time(s) per {Time; day/week/month:13537} Stool consistency: {stool consistency:24758} Straining: {yes/no:19897}.  Splinting: {yes/no:19897}.  Incomplete evacuation: {yes/no:19897}.  Patient {denies/ admits to:24761} accidental bowel leakage / fecal incontinence  Occurs: *** time(s) per {Time; day/week/month:13537}  Consistency with leakage: {stool consistency:24758} Bowel regimen: {bowel  regimen:24759} Last colonoscopy: Date ***, Results *** [facility] Colonoscopy   This patient has no relevant Health Maintenance data.     Sexual Function Sexually active: {yes/no:19897}.  Sexual orientation: {Sexual [phone]} Pain with sex: {pain with sex:24762}  Pelvic Pain {denies/ admits to:24761} pelvic pain Location: *** Pain occurs: *** Prior pain treatment: *** Improved by: *** Worsened by: ***   Past Medical History:  Past Medical History:  Diagnosis Date  . Pre-diabetes   . Prediabetes   . Uterine cancer [facility])      Past Surgical History:   Past Surgical History:  Procedure Laterality Date  . ABDOMINAL HYSTERECTOMY       Past OB/GYN History: OB History  No obstetric history on file.    Vaginal deliveries: ***,  Forceps/ Vacuum deliveries: ***, Cesarean section: *** Menopausal: {menopausal:24763} Contraception: ***. Last pap smear was ***.  Any history of abnormal pap smears: {yes/no:19897}.    Component Value Date/Time   DIAGPAP  02/08/2024 0824    - Negative for intraepithelial lesion or malignancy (NILM)   DIAGPAP  01/31/2021 1011    - Negative for intraepithelial lesion or malignancy (NILM)   DIAGPAP  05/21/2017 0000    NEGATIVE FOR INTRAEPITHELIAL LESIONS OR MALIGNANCY.   DIAGPAP SHIFT IN FLORA SUGGESTIVE OF BACTERIAL VAGINOSIS. 05/21/2017 0000   HPVHIGH Negative 02/08/2024 0824   ADEQPAP Satisfactory for evaluation. 02/08/2024 0824   ADEQPAP  01/31/2021 1011    Satisfactory for evaluation; transformation zone component ABSENT.   ADEQPAP  05/21/2017 0000    Satisfactory for evaluation  endocervical/transformation zone component ABSENT.    Medications: Patient has a current medication list which includes the following prescription(s): omega-3 fatty acids, pantoprazole , and triamcinolone  cream.   Allergies: Patient has no known allergies.   Social History:  Social History   Tobacco Use  . Smoking status: Never  .  Smokeless  tobacco: Never  Vaping Use  . Vaping status: Never Used  Substance Use Topics  . Alcohol use: Yes    Comment: occasionally  . Drug use: No    Relationship status: {relationship status:24764} Patient lives with ***.   Patient {ACTION; IS/IS WNU:78978602} employed ***. Regular exercise: {Yes/No:304960894} History of abuse: {Yes/No:304960894}  Family History:   Family History  Problem Relation Age of Onset  . Osteoarthritis Mother   . Heart disease Mother   . Stroke Neg Hx   . Breast cancer Neg Hx      Review of Systems: ROS   OBJECTIVE Physical Exam: There were no vitals filed for this visit.  Physical Exam   GU / Detailed Urogynecologic Evaluation:  Pelvic Exam: Normal external female genitalia; Bartholin's and Skene's glands normal in appearance; urethral meatus normal in appearance, no urethral masses or discharge.   CST: {gen negative/positive:315881}  Reflexes: bulbocavernosis {DESC; PRESENT/NOT PRESENT:21021351}, anocutaneous {DESC; PRESENT/NOT PRESENT:21021351} ***bilaterally.  Speculum exam reveals normal vaginal mucosa {With/Without:20273} atrophy. Cervix {exam; gyn cervix:30847}. Uterus {exam; pelvic uterus:30849}. Adnexa {exam; adnexa:12223}.    s/p hysterectomy: Speculum exam reveals normal vaginal mucosa {With/Without:20273}  atrophy and normal vaginal cuff.  Adnexa {exam; adnexa:12223}.    With apex supported, anterior compartment defect was {reduced:24765}  Pelvic floor strength {Roman # I-V:19040}/V, puborectalis {Roman # I-V:19040}/V external anal sphincter {Roman # I-V:19040}/V  Pelvic floor musculature: Right levator {Tender/Non-tender:20250}, Right obturator {Tender/Non-tender:20250}, Left levator {Tender/Non-tender:20250}, Left obturator {Tender/Non-tender:20250}  POP-Q:   POP-Q                                               Aa                                               Ba                                                 C                                                 Gh                                               Pb                                               tvl                                                Ap  Bp                                                 D      Rectal Exam:  Normal sphincter tone, {rectocele:24766} distal rectocele, enterocoele {DESC; PRESENT/NOT PRESENT:21021351}, no rectal masses, {sign of:24767} dyssynergia when asking the patient to bear down.  Post-Void Residual (PVR) by Bladder Scan: In order to evaluate bladder emptying, we discussed obtaining a postvoid residual and patient agreed to this procedure.  Procedure: The ultrasound unit was placed on the patient's abdomen in the suprapubic region after the patient had voided.      Laboratory Results: Lab Results  Component Value Date   COLORU yellow 05/03/2019   CLARITYU clear 05/03/2019   GLUCOSEUR negative 05/03/2019   BILIRUBINUR negative 05/03/2019   KETONESU neg 02/15/2018   SPECGRAV 1.025 05/03/2019   RBCUR negative 05/03/2019   PHUR 5.5 05/03/2019   PROTEINUR neg 02/15/2018   UROBILINOGEN 0.2 05/03/2019   LEUKOCYTESUR Negative 05/03/2019    Lab Results  Component Value Date   CREATININE 0.67 07/10/2024   CREATININE 0.68 06/17/2023   CREATININE 0.70 01/31/2021    Lab Results  Component Value Date   HGBA1C 6.1 (A) 06/23/2024    Lab Results  Component Value Date   HGB 14.2 07/10/2024     ASSESSMENT AND PLAN Nina Hawkins is a 53 y.o. with: No diagnosis found.  There are no diagnoses linked to this encounter.   Lianne ONEIDA Gillis, MD

## 2024-08-23 ENCOUNTER — Ambulatory Visit (INDEPENDENT_AMBULATORY_CARE_PROVIDER_SITE_OTHER): Payer: Self-pay | Admitting: Nurse Practitioner

## 2024-08-23 ENCOUNTER — Other Ambulatory Visit: Payer: Self-pay

## 2024-08-23 ENCOUNTER — Encounter: Payer: Self-pay | Admitting: Nurse Practitioner

## 2024-08-23 VITALS — BP 119/74 | HR 78 | Wt 178.0 lb

## 2024-08-23 DIAGNOSIS — R7303 Prediabetes: Secondary | ICD-10-CM

## 2024-08-23 DIAGNOSIS — I10 Essential (primary) hypertension: Secondary | ICD-10-CM

## 2024-08-23 DIAGNOSIS — Z1329 Encounter for screening for other suspected endocrine disorder: Secondary | ICD-10-CM

## 2024-08-23 DIAGNOSIS — R6882 Decreased libido: Secondary | ICD-10-CM | POA: Insufficient documentation

## 2024-08-23 NOTE — Progress Notes (Signed)
 Established Patient Office Visit  Subjective:  Patient ID: Nina Hawkins, female    DOB: May 22, 1971  Age: 53 y.o. MRN: [id number]  CC:  Chief Complaint  Patient presents with   Hypertension    HPI  Discussed the use of AI scribe software for clinical note transcription with the patient, who gave verbal consent to proceed.  History of Present Illness Nina Hawkins is a 53 year old female who presents with concerns about low libido and follow-up on hypertension. Interpretation services provided by a medical interpreter  Her blood pressure was elevated at 144/94 two months ago, but it is now controlled at 119/74. She engages in physical activity by walking for exercise two to three days a week for about half an hour each time and is attempting to reduce her intake of flour and carbohydrates.  She is concerned about a decrease in sexual desire, which she attributes to possible menopause. She has been sexually active with her partner but reports a lack of interest in intimacy, which began approximately two years ago. She underwent a hysterectomy about ten years ago and has not had menstrual periods since. She is not on any medications that could affect libido, such as antidepressants, and does not smoke. She is experiencing relationship difficulties due to this issue.    Assessment & Plan        Past Medical History:  Diagnosis Date   Pre-diabetes    Prediabetes    Uterine cancer (HCC)     Past Surgical History:  Procedure Laterality Date   ABDOMINAL HYSTERECTOMY      Family History  Problem Relation Age of Onset   Osteoarthritis Mother    Heart disease Mother    Stroke Neg Hx    Breast cancer Neg Hx     Social History   Socioeconomic History   Marital status: Single    Spouse name: Not on file   Number of children: 3   Years of education: Not on file   Highest education level: Not on file  Occupational History   Not on file  Tobacco Use    Smoking status: Never   Smokeless tobacco: Never  Vaping Use   Vaping status: Never Used  Substance and Sexual Activity   Alcohol use: Yes    Comment: occasionally   Drug use: No   Sexual activity: Not Currently    Birth control/protection: Surgical  Other Topics Concern   Not on file  Social History Narrative   Pt was born in [place]   Lives with her children    Social Drivers of Health   Financial Resource Strain: Not on file  Food Insecurity: No Food Insecurity (12/30/2023)   Hunger Vital Sign    Worried About Running Out of Food in the Last Year: Never true    Ran Out of Food in the Last Year: Never true  Transportation Needs: No Transportation Needs (12/30/2023)   PRAPARE - Administrator, Civil Service (Medical): No    Lack of Transportation (Non-Medical): No  Physical Activity: Not on file  Stress: Not on file  Social Connections: Not on file  Intimate Partner Violence: Not on file    Outpatient Medications Prior to Visit  Medication Sig Dispense Refill   Omega-3 Fatty Acids (OMEGA 3 PO) Take by mouth. (Patient not taking: Reported on 08/23/2024)     pantoprazole  (PROTONIX ) 40 MG tablet Take 1 tablet (40 mg total) by mouth daily. Una tableta un  vez al dia [facility] (Patient not taking: Reported on 08/23/2024) 30 tablet 1   triamcinolone  cream (KENALOG ) 0.1 % Apply 1 Application topically 2 (two) times daily. (Patient not taking: Reported on 08/23/2024) 30 g 1   No facility-administered medications prior to visit.    No Known Allergies  ROS Review of Systems  Constitutional:  Negative for appetite change, chills, fatigue and fever.  HENT:  Negative for congestion, postnasal drip, rhinorrhea and sneezing.   Respiratory:  Negative for cough, shortness of breath and wheezing.   Cardiovascular:  Negative for chest pain, palpitations and leg swelling.  Gastrointestinal:  Negative for abdominal pain, constipation, nausea and vomiting.   Genitourinary:  Negative for difficulty urinating, dysuria, flank pain and frequency.  Musculoskeletal:  Negative for arthralgias, back pain, joint swelling and myalgias.  Skin:  Negative for color change, pallor, rash and wound.  Neurological:  Negative for dizziness, facial asymmetry, weakness, numbness and headaches.  Psychiatric/Behavioral:  Negative for behavioral problems, confusion, self-injury and suicidal ideas.       Objective:    Physical Exam Vitals and nursing note reviewed.  Constitutional:      General: She is not in acute distress.    Appearance: Normal appearance. She is obese. She is not ill-appearing, toxic-appearing or diaphoretic.  Eyes:     General: No scleral icterus.       Right eye: No discharge.        Left eye: No discharge.     Extraocular Movements: Extraocular movements intact.     Conjunctiva/sclera: Conjunctivae normal.  Cardiovascular:     Rate and Rhythm: Normal rate and regular rhythm.     Pulses: Normal pulses.     Heart sounds: Normal heart sounds. No murmur heard.    No friction rub. No gallop.  Pulmonary:     Effort: Pulmonary effort is normal. No respiratory distress.     Breath sounds: Normal breath sounds. No stridor. No wheezing, rhonchi or rales.  Chest:     Chest wall: No tenderness.  Abdominal:     General: There is no distension.     Palpations: Abdomen is soft.     Tenderness: There is no abdominal tenderness. There is no right CVA tenderness, left CVA tenderness or guarding.  Musculoskeletal:        General: No swelling, tenderness, deformity or signs of injury.     Right lower leg: No edema.     Left lower leg: No edema.  Skin:    General: Skin is warm and dry.     Capillary Refill: Capillary refill takes less than 2 seconds.     Coloration: Skin is not jaundiced or pale.     Findings: No bruising, erythema or lesion.  Neurological:     Mental Status: She is alert and oriented to person, place, and time.     Motor: No  weakness.     Coordination: Coordination normal.     Gait: Gait normal.  Psychiatric:        Mood and Affect: Mood normal.        Behavior: Behavior normal.        Thought Content: Thought content normal.        Judgment: Judgment normal.     BP 119/74   Pulse 78   Wt 178 lb (80.7 kg)   SpO2 99%   BMI 38.52 kg/m  Wt Readings from Last 3 Encounters:  08/23/24 178 lb (80.7 kg)  06/23/24 177 lb (  80.3 kg)  12/30/23 174 lb (78.9 kg)    Lab Results  Component Value Date   TSH 4.000 12/25/2016   Lab Results  Component Value Date   WBC 5.7 07/10/2024   HGB 14.2 07/10/2024   HCT 44.5 07/10/2024   MCV 87 07/10/2024   PLT 270 07/10/2024   Lab Results  Component Value Date   NA 138 07/10/2024   K 4.0 07/10/2024   CO2 24 07/10/2024   GLUCOSE 115 (H) 07/10/2024   BUN 13 07/10/2024   CREATININE 0.67 07/10/2024   BILITOT 0.3 07/10/2024   ALKPHOS 88 07/10/2024   AST 20 07/10/2024   ALT 21 07/10/2024   PROT 6.6 07/10/2024   ALBUMIN 4.0 07/10/2024   CALCIUM  9.1 07/10/2024   ANIONGAP 11 12/24/2017   EGFR 105 07/10/2024   Lab Results  Component Value Date   CHOL 160 07/10/2024   Lab Results  Component Value Date   HDL 62 07/10/2024   Lab Results  Component Value Date   LDLCALC 76 07/10/2024   Lab Results  Component Value Date   TRIG 126 07/10/2024   Lab Results  Component Value Date   CHOLHDL 2.6 07/10/2024   Lab Results  Component Value Date   HGBA1C 6.1 (A) 06/23/2024      Assessment & Plan:   Problem List Items Addressed This Visit       Cardiovascular and Mediastinum   Hypertension - Primary   Controlled without medication DASH diet and commitment to daily physical activity for a minimum of 30 minutes discussed and encouraged, as a part of hypertension management. The importance of attaining a healthy weight is also discussed.     08/23/2024    3:16 PM 08/23/2024    3:11 PM 06/23/2024   10:41 AM 06/23/2024   10:27 AM 05/22/2024    6:02 PM  04/18/2024    4:51 PM 12/30/2023    2:19 PM  BP/Weight  Systolic BP 119 128 144 124 129 136 140  Diastolic BP 74 96 94 94 87 80 99  Wt. (Lbs)  178  177   174  BMI  38.52 kg/m2  38.3 kg/m2   36.37 kg/m2             Other   Prediabetes   Lab Results  Component Value Date   HGBA1C 6.1 (A) 06/23/2024  Counseled on low-carb diet Lose weight      Morbid obesity (HCC)   Wt Readings from Last 3 Encounters:  08/23/24 178 lb (80.7 kg)  06/23/24 177 lb (80.3 kg)  12/30/23 174 lb (78.9 kg)   Body mass index is 38.52 kg/m.  Counseled on low-carb diet Encouraged moderate to vigorous exercise and at least 150 minutes weekly as tolerated Benefits of healthy weights discussed      Decreased libido    Possibly related to hormonal changes post-uterine surgery. No medications contributing. Symptoms persistent for two years. - Referred to gynecologist for evaluation of hormonal imbalance and potential hormone replacement therapy. Screening for thyroid disease       Relevant Orders   Ambulatory referral to Gynecology   Other Visit Diagnoses       Screening for thyroid disorder       Relevant Orders   TSH       No orders of the defined types were placed in this encounter.   Follow-up: Return in about 1 year (around 08/23/2025) for CPE.    Rylynn Schoneman R Arles Rumbold, FNP

## 2024-08-23 NOTE — Assessment & Plan Note (Signed)
  Possibly related to hormonal changes post-uterine surgery. No medications contributing. Symptoms persistent for two years. - Referred to gynecologist for evaluation of hormonal imbalance and potential hormone replacement therapy. Screening for thyroid disease

## 2024-08-23 NOTE — Assessment & Plan Note (Signed)
 Lab Results  Component Value Date   HGBA1C 6.1 (A) 06/23/2024  Counseled on low-carb diet Lose weight

## 2024-08-23 NOTE — Assessment & Plan Note (Addendum)
 Wt Readings from Last 3 Encounters:  08/23/24 178 lb (80.7 kg)  06/23/24 177 lb (80.3 kg)  12/30/23 174 lb (78.9 kg)   Body mass index is 38.52 kg/m.  Counseled on low-carb diet Encouraged moderate to vigorous exercise and at least 150 minutes weekly as tolerated Benefits of healthy weights discussed

## 2024-08-23 NOTE — Patient Instructions (Signed)
 1. Hypertension, unspecified type (Primary)   2. Morbid obesity (HCC)   3. Decreased libido  - Ambulatory referral to Gynecology  4. Screening for thyroid disorder  - TSH; Future    It is important that you exercise regularly at least 30 minutes 5 times a week as tolerated  Think about what you will eat, plan ahead. Choose  clean, green, fresh or frozen over canned, processed or packaged foods which are more sugary, salty and fatty. 70 to 75% of food eaten should be vegetables and fruit. Three meals at set times with snacks allowed between meals, but they must be fruit or vegetables. Aim to eat over a 12 hour period , example 7 am to 7 pm, and STOP after  your last meal of the day. Drink water,generally about 64 ounces per day, no other drink is as healthy. Fruit juice is best enjoyed in a healthy way, by EATING the fruit.  Thanks for choosing [facility] we consider it a privelige to serve you.

## 2024-08-23 NOTE — Assessment & Plan Note (Addendum)
 Controlled without medication DASH diet and commitment to daily physical activity for a minimum of 30 minutes discussed and encouraged, as a part of hypertension management. The importance of attaining a healthy weight is also discussed.     08/23/2024    3:16 PM 08/23/2024    3:11 PM 06/23/2024   10:41 AM 06/23/2024   10:27 AM 05/22/2024    6:02 PM 04/18/2024    4:51 PM 12/30/2023    2:19 PM  BP/Weight  Systolic BP 119 128 144 124 129 136 140  Diastolic BP 74 96 94 94 87 80 99  Wt. (Lbs)  178  177   174  BMI  38.52 kg/m2  38.3 kg/m2   36.37 kg/m2

## 2024-08-25 ENCOUNTER — Other Ambulatory Visit: Payer: Self-pay

## 2024-08-25 ENCOUNTER — Ambulatory Visit (INDEPENDENT_AMBULATORY_CARE_PROVIDER_SITE_OTHER): Payer: Self-pay | Admitting: Obstetrics and Gynecology

## 2024-08-25 ENCOUNTER — Encounter: Payer: Self-pay | Admitting: Obstetrics and Gynecology

## 2024-08-25 VITALS — BP 120/82 | HR 74 | Ht <= 58 in | Wt 175.4 lb

## 2024-08-25 DIAGNOSIS — Z1329 Encounter for screening for other suspected endocrine disorder: Secondary | ICD-10-CM

## 2024-08-25 DIAGNOSIS — N393 Stress incontinence (female) (male): Secondary | ICD-10-CM

## 2024-08-25 DIAGNOSIS — R350 Frequency of micturition: Secondary | ICD-10-CM

## 2024-08-25 DIAGNOSIS — N812 Incomplete uterovaginal prolapse: Secondary | ICD-10-CM | POA: Insufficient documentation

## 2024-08-25 DIAGNOSIS — N3281 Overactive bladder: Secondary | ICD-10-CM

## 2024-08-25 DIAGNOSIS — N816 Rectocele: Secondary | ICD-10-CM

## 2024-08-25 LAB — POCT URINALYSIS DIP (CLINITEK)
Bilirubin, UA: NEGATIVE
Blood, UA: NEGATIVE
Glucose, UA: NEGATIVE mg/dL
Ketones, POC UA: NEGATIVE mg/dL
Leukocytes, UA: NEGATIVE
Nitrite, UA: NEGATIVE
POC PROTEIN,UA: NEGATIVE
Spec Grav, UA: 1.01 (ref 1.010–1.025)
Urobilinogen, UA: 0.2 U/dL
pH, UA: 5.5 (ref 5.0–8.0)

## 2024-08-25 MED ORDER — TROSPIUM CHLORIDE ER 60 MG PO CP24
1.0000 | ORAL_CAPSULE | Freq: Every day | ORAL | 5 refills | Status: DC
  Filled 2024-08-25: qty 30, 30d supply, fill #0

## 2024-08-25 NOTE — Assessment & Plan Note (Signed)
-   We discussed the symptoms of overactive bladder (OAB), which include urinary urgency, urinary frequency, nocturia, with or without urge incontinence.  While we do not know the exact etiology of OAB, several treatment options exist. We discussed management including behavioral therapy (decreasing bladder irritants, urge suppression strategies, timed voids, bladder retraining), physical therapy, medication; for refractory cases posterior tibial nerve stimulation, sacral neuromodulation, and intravesical botulinum toxin injection.  - Prescribed Trospium 60mg  ER daily. For anticholinergic medications, we discussed the potential side effects of anticholinergics including dry eyes, dry mouth, constipation, cognitive impairment and urinary retention.

## 2024-08-25 NOTE — Assessment & Plan Note (Signed)
-   This is less bothersome to patient so will address OAB first and then reassess need for treatment.
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
## 2024-08-25 NOTE — Patient Instructions (Signed)
 We discussed the symptoms of overactive bladder (OAB), which include urinary urgency, urinary frequency, night-time urination, with or without urge incontinence.  We discussed management including behavioral therapy (decreasing bladder irritants by following a bladder diet, urge suppression strategies, timed voids, bladder retraining), physical therapy, medication; and for refractory cases posterior tibial nerve stimulation, sacral neuromodulation, and intravesical botulinum toxin injection.   For treatment of stress urinary incontinence, which is leakage with physical activity/movement/strainging/coughing, we discussed expectant management versus nonsurgical options versus surgery. Nonsurgical options include weight loss, physical therapy, as well as a pessary.  Surgical options include a midurethral sling, which is a synthetic mesh sling that acts like a hammock under the urethra to prevent leakage of urine, a Burch urethropexy, and transurethral injection of a bulking agent.    Constipation: Our goal is to achieve formed bowel movements daily or every-other-day.  You may need to try different combinations of the following options to find what works best for you - everybody's body works differently so feel free to adjust the dosages as needed.  Some options to help maintain bowel health include:  Dietary changes (more leafy greens, vegetables and fruits; less processed foods) Fiber supplementation (Benefiber, FiberCon, Metamucil or Psyllium). Start slow and increase gradually to full dose. Over-the-counter agents such as: stool softeners (Docusate or Colace) and/or laxatives (Miralax , milk of magnesia)  Power Pudding is a natural mixture that may help your constipation.  To make blend 1 cup applesauce, 1 cup wheat bran, and 3/4 cup prune juice, refrigerate and then take 1 tablespoon daily with a large glass of water as needed.

## 2024-08-25 NOTE — Assessment & Plan Note (Signed)
 Stage II Posterior prolapse - She is asymptomatic but we discussed avoiding constipation and hard stool to prevent progression.  - She takes miralax  as needed. Recommended daily fiber supplement or stool softener.

## 2024-08-26 LAB — TSH: TSH: 2.35 u[IU]/mL (ref 0.450–4.500)

## 2024-08-28 ENCOUNTER — Ambulatory Visit: Payer: Self-pay | Admitting: Nurse Practitioner

## 2024-08-29 ENCOUNTER — Other Ambulatory Visit: Payer: Self-pay

## 2024-08-30 ENCOUNTER — Telehealth: Payer: Self-pay

## 2024-08-30 ENCOUNTER — Other Ambulatory Visit: Payer: Self-pay

## 2024-08-30 DIAGNOSIS — N3281 Overactive bladder: Secondary | ICD-10-CM

## 2024-08-30 MED ORDER — TROSPIUM CHLORIDE 20 MG PO TABS
20.0000 mg | ORAL_TABLET | Freq: Two times a day (BID) | ORAL | 5 refills | Status: AC
  Filled 2024-08-30: qty 60, 30d supply, fill #0

## 2024-08-30 NOTE — Telephone Encounter (Signed)
 Patient called wanting to let Dr.Schroeder know she was not able to pick up the medication since the cost was $152. Patient wanted to know if there is another medication she could try that would not be to expensive for her.  Please advise

## 2024-08-30 NOTE — Telephone Encounter (Signed)
 Ordered Trospium 20mg  BID tablets. This should be less expensive. May need to send to another pharmacy if cost is still high.

## 2024-10-16 ENCOUNTER — Encounter: Payer: Self-pay | Admitting: Obstetrics and Gynecology

## 2024-10-16 ENCOUNTER — Other Ambulatory Visit: Payer: Self-pay

## 2024-10-16 ENCOUNTER — Ambulatory Visit: Payer: Self-pay | Admitting: Obstetrics and Gynecology

## 2024-10-16 VITALS — BP 120/83 | HR 97

## 2024-10-16 DIAGNOSIS — R1909 Other intra-abdominal and pelvic swelling, mass and lump: Secondary | ICD-10-CM

## 2024-10-16 DIAGNOSIS — R102 Pelvic and perineal pain unspecified side: Secondary | ICD-10-CM

## 2024-10-16 DIAGNOSIS — N3281 Overactive bladder: Secondary | ICD-10-CM

## 2024-10-16 DIAGNOSIS — N393 Stress incontinence (female) (male): Secondary | ICD-10-CM

## 2024-10-16 DIAGNOSIS — N952 Postmenopausal atrophic vaginitis: Secondary | ICD-10-CM

## 2024-10-16 MED ORDER — ESTRADIOL 0.01 % VA CREA
0.5000 g | TOPICAL_CREAM | VAGINAL | 11 refills | Status: AC
  Filled 2024-10-16: qty 42.5, 255d supply, fill #0

## 2024-10-16 NOTE — Patient Instructions (Addendum)
 Please consider options for Stress incontinence treatment. This would include pessary or urethral bulking.   For your Urge incontinence please fill out a bladder diary.   Please call to schedule the CT.

## 2024-10-16 NOTE — Progress Notes (Signed)
 [facility] Return Visit  SUBJECTIVE  History of Present Illness:  Due to language barrier, an interpreter was present during the history-taking and subsequent discussion (and for part of the physical exam) with this patient.   Nina Hawkins is a 54 y.o. female seen in follow-up for OAB, SUI, and also reports she has a LLQ bulge she is concerned about. Plan at last visit was start Trospium  20mg  x2 daily. Patient reports she does not feel the medication is helping. She states she is still having significant leaking with cough, sneeze, intercourse.   Patient also reports she picked up something heavy and felt a pain in the upper vaginal/lower abdominal region that is causing her concern.      Past Medical History: Patient  has a past medical history of Pre-diabetes, Prediabetes, and Uterine cancer (HCC).   Past Surgical History: She  has a past surgical history that includes Vaginal hysterectomy.   Medications: She has a current medication list which includes the following prescription(s): [START ON 10/19/2024] estradiol , omega-3 fatty acids, pantoprazole , triamcinolone  cream, and trospium .   Allergies: Patient has no known allergies.   Social History: Patient  reports that she has never smoked. She has never used smokeless tobacco. She reports current alcohol use. She reports that she does not use drugs.     OBJECTIVE     Physical Exam: Vitals:   10/16/24 1526  BP: 120/83  Pulse: 97   Gen: No apparent distress, A&O x 3.  Detailed Urogynecologic Evaluation:  Deferred.  Patient does have a small bulge noted at inguinal region on the left side. There is a palpable area that patient reports is tender and painful with pressure applied.    ASSESSMENT AND PLAN    Nina Hawkins is a 54 y.o. with:  1. SUI (stress urinary incontinence, female)   2. OAB (overactive bladder)   3. Vaginal atrophy   4. Pelvic pain   5. Inguinal bulge    Patient is  reporting that her SUI is her most bothersome symptom at this time. I would like patient to fill out a bladder diary to determine more of her leakage symptoms. Patient to consider options between pessary and urethral bulking as well.  Patient states this is not as bothersome to her and it is unclear if she thinks the medication was helpful, as she continues to confuse SUI/UUI despite repeated attempts to explain the difference with interpretor. Patient may need urodynamics.  Patient to start vaginal estrogen cream to support vaginal tissues for intercourse. We discussed doing this nightly for two weeks and then twice a week after into the vagina.  Patient has pelvic pain higher in the vagina between the abdomen and pelvis.  There is a palpable bulge that is tender on the left side that is concerning lump. Patinet is worried about herniation due to heavy lifting and straining. Will get pelvic CT to rule out mass or herniation. We discussed that she may need to speak with a general surgeon if there is a hernia present. Reassured patient this is not her ovary dropping beneath the skin.   Patient to schedule CT scan and fill out bladder diary then follow up.   Hartman Minahan G Selma Mink, NP

## 2024-10-17 ENCOUNTER — Encounter: Payer: Self-pay | Admitting: Obstetrics and Gynecology

## 2024-10-17 ENCOUNTER — Other Ambulatory Visit: Payer: Self-pay

## 2024-11-16 ENCOUNTER — Ambulatory Visit (HOSPITAL_COMMUNITY): Payer: Self-pay

## 2024-11-16 DIAGNOSIS — R1909 Other intra-abdominal and pelvic swelling, mass and lump: Secondary | ICD-10-CM

## 2024-11-16 DIAGNOSIS — R102 Pelvic and perineal pain unspecified side: Secondary | ICD-10-CM

## 2024-11-16 MED ORDER — IOHEXOL 300 MG/ML  SOLN
100.0000 mL | Freq: Once | INTRAMUSCULAR | Status: AC | PRN
  Administered 2024-11-16: 100 mL via INTRAVENOUS

## 2024-12-18 ENCOUNTER — Ambulatory Visit: Admitting: Obstetrics and Gynecology

## 2025-08-28 ENCOUNTER — Encounter: Payer: Self-pay | Admitting: Nurse Practitioner
